# Patient Record
Sex: Female | Born: 1940 | Race: White | Hispanic: No | Marital: Married | Smoking: Never smoker
Health system: Southern US, Community
[De-identification: ages and names within clinical notes are randomized; demographics above are authoritative.]

## PROBLEM LIST (undated history)

## (undated) DIAGNOSIS — K449 Diaphragmatic hernia without obstruction or gangrene: Secondary | ICD-10-CM

## (undated) DIAGNOSIS — J45909 Unspecified asthma, uncomplicated: Secondary | ICD-10-CM

## (undated) DIAGNOSIS — Q211 Atrial septal defect: Secondary | ICD-10-CM

## (undated) DIAGNOSIS — K219 Gastro-esophageal reflux disease without esophagitis: Secondary | ICD-10-CM

## (undated) DIAGNOSIS — T8859XA Other complications of anesthesia, initial encounter: Secondary | ICD-10-CM

## (undated) DIAGNOSIS — R011 Cardiac murmur, unspecified: Secondary | ICD-10-CM

## (undated) DIAGNOSIS — E039 Hypothyroidism, unspecified: Secondary | ICD-10-CM

## (undated) DIAGNOSIS — Q2112 Patent foramen ovale: Secondary | ICD-10-CM

## (undated) DIAGNOSIS — D649 Anemia, unspecified: Secondary | ICD-10-CM

## (undated) DIAGNOSIS — M1990 Unspecified osteoarthritis, unspecified site: Secondary | ICD-10-CM

## (undated) DIAGNOSIS — R519 Headache, unspecified: Secondary | ICD-10-CM

## (undated) DIAGNOSIS — N281 Cyst of kidney, acquired: Secondary | ICD-10-CM

## (undated) DIAGNOSIS — E079 Disorder of thyroid, unspecified: Secondary | ICD-10-CM

## (undated) HISTORY — PX: COLONOSCOPY: SHX174

## (undated) HISTORY — PX: UPPER GASTROINTESTINAL ENDOSCOPY: SHX188

## (undated) HISTORY — DX: Unspecified asthma, uncomplicated: J45.909

## (undated) HISTORY — PX: DIAGNOSTIC LAPAROSCOPY: SUR761

## (undated) HISTORY — DX: Atrial septal defect: Q21.1

## (undated) HISTORY — DX: Diaphragmatic hernia without obstruction or gangrene: K44.9

## (undated) HISTORY — PX: ABDOMINAL HYSTERECTOMY: SHX81

## (undated) HISTORY — PX: EYE SURGERY: SHX253

## (undated) HISTORY — PX: OTHER SURGICAL HISTORY: SHX169

## (undated) HISTORY — DX: Anemia, unspecified: D64.9

## (undated) HISTORY — DX: Disorder of thyroid, unspecified: E07.9

## (undated) HISTORY — DX: Unspecified osteoarthritis, unspecified site: M19.90

## (undated) HISTORY — DX: Gastro-esophageal reflux disease without esophagitis: K21.9

## (undated) HISTORY — DX: Patent foramen ovale: Q21.12

## (undated) HISTORY — PX: JOINT REPLACEMENT: SHX530

## (undated) HISTORY — PX: APPENDECTOMY: SHX54

## (undated) HISTORY — PX: TONSILLECTOMY: SUR1361

## (undated) HISTORY — PX: RADICAL VAGINAL HYSTERECTOMY: SUR662

---

## 1968-06-22 DIAGNOSIS — Z9289 Personal history of other medical treatment: Secondary | ICD-10-CM

## 1968-06-22 HISTORY — DX: Personal history of other medical treatment: Z92.89

## 2016-06-22 DIAGNOSIS — S060XAA Concussion with loss of consciousness status unknown, initial encounter: Secondary | ICD-10-CM

## 2016-06-22 HISTORY — DX: Concussion with loss of consciousness status unknown, initial encounter: S06.0XAA

## 2019-05-15 ENCOUNTER — Ambulatory Visit (INDEPENDENT_AMBULATORY_CARE_PROVIDER_SITE_OTHER): Payer: Medicare Other

## 2019-05-15 ENCOUNTER — Telehealth: Payer: Self-pay | Admitting: *Deleted

## 2019-05-15 ENCOUNTER — Other Ambulatory Visit: Payer: Self-pay

## 2019-05-15 ENCOUNTER — Ambulatory Visit (INDEPENDENT_AMBULATORY_CARE_PROVIDER_SITE_OTHER): Payer: Medicare Other | Admitting: Podiatry

## 2019-05-15 DIAGNOSIS — L600 Ingrowing nail: Secondary | ICD-10-CM

## 2019-05-15 DIAGNOSIS — M25572 Pain in left ankle and joints of left foot: Secondary | ICD-10-CM | POA: Diagnosis not present

## 2019-05-15 DIAGNOSIS — M76829 Posterior tibial tendinitis, unspecified leg: Secondary | ICD-10-CM

## 2019-05-15 DIAGNOSIS — M25571 Pain in right ankle and joints of right foot: Secondary | ICD-10-CM

## 2019-05-15 DIAGNOSIS — M779 Enthesopathy, unspecified: Secondary | ICD-10-CM

## 2019-05-15 MED ORDER — MELOXICAM 7.5 MG PO TABS: 7.5000 mg | ORAL_TABLET | Freq: Every day | ORAL | 0 refills | Status: DC

## 2019-05-15 NOTE — Telephone Encounter (Signed)
Pt states the boot she has is to her knee, and would like a shorter one.

## 2019-05-15 NOTE — Telephone Encounter (Signed)
I spoke with pt and informed we had boots that stopped mid lower leg if she would like to pick one up we could bill her insurance. Pt states understanding.

## 2019-05-22 DIAGNOSIS — M76829 Posterior tibial tendinitis, unspecified leg: Secondary | ICD-10-CM | POA: Insufficient documentation

## 2019-05-22 NOTE — Progress Notes (Signed)
Subjective:   Patient ID: Carol Lowery, female   DOB: 78 y.o.   MRN: [id number]   HPI 78 year old female presents the office today for concerns of left ankle pain most on the medial aspect that she points to.  She states that she has similar symptoms in 2015 and she was told that she had a tear of the tendon she is in a boot for some time.  Since then she has been doing well however recently she states that she has been on her feet more she was moving to [place] and she started to have pain and swelling to the ankle.  She denies any specific injury.  She does get some pain to the outside aspect as well.  She is also concerned about ingrowing toenails but denies any drainage or pus coming from the nails today.   Review of Systems  All other systems reviewed and are negative.  No past medical history on file.   Current Outpatient Medications:  .  aspirin EC 81 MG tablet, Take 81 mg by mouth daily., Disp: , Rfl:  .  atorvastatin (LIPITOR) 10 MG tablet, Take 10 mg by mouth daily., Disp: , Rfl:  .  Biotin 2500 MCG CAPS, Take 2,500 mcg by mouth 2 (two) times daily., Disp: , Rfl:  .  Calcium-Vitamin D-Vitamin K 650-12.5-40 MG-MCG-MCG CHEW, Chew 650 mg by mouth 2 (two) times daily., Disp: , Rfl:  .  esomeprazole (NEXIUM) 20 MG capsule, Take 20 mg by mouth daily at 12 noon., Disp: , Rfl:  .  ferrous sulfate 324 MG TBEC, Take 665 mg by mouth 2 (two) times a week., Disp: , Rfl:  .  levothyroxine (SYNTHROID) 75 MCG tablet, Take 75 mcg by mouth., Disp: , Rfl:  .  Multiple Vitamin (MULTIVITAMIN) LIQD, Take 5 mLs by mouth daily., Disp: , Rfl:  .  vitamin C (ASCORBIC ACID) 500 MG tablet, Take 500 mg by mouth daily., Disp: , Rfl:  .  meloxicam (MOBIC) 7.5 MG tablet, Take 1 tablet (7.5 mg total) by mouth daily., Disp: 30 tablet, Rfl: 0  Not on File       Objective:  Physical Exam  General: AAO x3, NAD  Dermatological: Mild ingrowing of the toenails to bilateral medical hallux without signs of  infection. No tenderness today. No open lesions.   Vascular: Dorsalis Pedis artery and Posterior Tibial artery pedal pulses are 2/4 bilateral with immedate capillary fill time. Pedal hair growth present. No varicosities and no lower extremity edema present bilateral. There is no pain with calf compression, swelling, warmth, erythema.   Neruologic: Grossly intact via light touch bilateral. Vibratory intact via tuning fork bilateral. Protective threshold with Semmes Wienstein monofilament intact to all pedal sites bilateral. Patellar and Achilles deep tendon reflexes 2+ bilateral. No Babinski or clonus noted bilateral.   Musculoskeletal: There is tenderness on medial aspect of the left ankle there is localized edema to this area.  This is on the course the posterior tibial tendon. Flatfoot is present. There is no erythema associated with the swelling.  She has difficulty doing a single heel rise on the left side.  Muscular strength 5/5 in all groups tested bilateral.  Gait: Unassisted, Nonantalgic.       Assessment:   PTTD left; ingrown toenail     Plan:  -Treatment options discussed including all alternatives, risks, and complications -Etiology of symptoms were discussed -X-rays were obtained and reviewed with the patient.  No evidence of acute fracture or stress fracture  identified.  Flatfoot evident. -Given her pain and swelling do recommend immobilization in a cam boot which she has at home.  I want her to ice elevate. Prescribed mobic. Discussed side effects of the medication and directed to stop if any are to occur and call the office.  I will see her back next couple of weeks at that point she is doing better we will start rehabbing work and orthotics.  If no improvement will order an MRI. -Discussed partial nail avulsions of ingrown toenails but she was well.  Recommended.  Debridement of the nail.  Monitoring signs or symptoms of infection.  Trula Slade DPM

## 2019-06-06 ENCOUNTER — Other Ambulatory Visit: Payer: Self-pay

## 2019-06-06 ENCOUNTER — Ambulatory Visit (INDEPENDENT_AMBULATORY_CARE_PROVIDER_SITE_OTHER): Payer: Medicare Other | Admitting: Podiatry

## 2019-06-06 DIAGNOSIS — M779 Enthesopathy, unspecified: Secondary | ICD-10-CM | POA: Diagnosis not present

## 2019-06-06 DIAGNOSIS — M76829 Posterior tibial tendinitis, unspecified leg: Secondary | ICD-10-CM

## 2019-06-06 NOTE — Patient Instructions (Signed)
Look at getting a Vionic slipper    Posterior Tibial Tendinitis Rehab Ask your health care provider which exercises are safe for you. Do exercises exactly as told by your health care provider and adjust them as directed. It is normal to feel mild stretching, pulling, tightness, or discomfort as you do these exercises. Stop right away if you feel sudden pain or your pain gets worse. Do not begin these exercises until told by your health care provider. Stretching and range-of-motion exercises These exercises warm up your muscles and joints and improve the movement and flexibility in your ankle and foot. These exercises may also help to relieve pain. Standing wall calf stretch, knee straight  1. Stand with your hands against a wall. 2. Extend your left / right leg behind you, and bend your front knee slightly. If directed, place a folded washcloth under the arch of your foot for support. 3. Point the toes of your back foot slightly inward. 4. Keeping your heels on the floor and your back knee straight, shift your weight toward the wall. Do not allow your back to arch. You should feel a gentle stretch in your upper left / right calf. 5. Hold this position for __________ seconds. Repeat __________ times. Complete this exercise __________ times a day. Standing wall calf stretch, knee bent 1. Stand with your hands against a wall. 2. Extend your left / right leg behind you, and bend your front knee slightly. If directed, place a folded washcloth under the arch of your foot for support. 3. Point the toes of your back foot slightly inward. 4. Unlock your back knee so it is bent. Keep your heels on the floor. You should feel a gentle stretch deep in your lower left / right calf. 5. Hold this position for __________ seconds. Repeat __________ times. Complete this exercise __________ times a day. Strengthening exercises These exercises build strength and endurance in your ankle and foot. Endurance is the  ability to use your muscles for a long time, even after they get tired. Ankle inversion with band 1. Secure one end of a rubber exercise band or tubing to a fixed object, such as a table leg or a pole, that will stay still when the band is pulled. 2. Loop the other end of the band around the middle of your left / right foot. 3. Sit on the floor facing the object with your left / right leg extended. The band or tube should be slightly tense when your foot is relaxed. 4. Leading with your big toe, slowly bring your left / right foot and ankle inward, toward your other foot (inversion). 5. Hold this position for __________ seconds. 6. Slowly return your foot to the starting position. Repeat __________ times. Complete this exercise __________ times a day. Towel curls  1. Sit in a chair on a non-carpeted surface, and put your feet on the floor. 2. Place a towel in front of your feet. If told by your health care provider, add a __________ weight to the end of the towel. 3. Keeping your heel on the floor, put your left / right foot on the towel. 4. Pull the towel toward you by grabbing the towel with your toes and curling them under. Keep your heel on the floor while you do this. 5. Let your toes relax. 6. Grab the towel with your toes again. Keep going until the towel is completely underneath your foot. Repeat __________ times. Complete this exercise __________ times a day. Balance  exercise This exercise improves or maintains your balance. Balance is important in preventing falls. Single leg stand 1. Without wearing shoes, stand near a railing or in a doorway. You may hold on to the railing or door frame as needed for balance. 2. Stand on your left / right foot. Keep your big toe down on the floor and try to keep your arch lifted. ? If balancing in this position is too easy, try the exercise with your eyes closed or while standing on a pillow. 3. Hold this position for __________ seconds. Repeat  __________ times. Complete this exercise __________ times a day. This information is not intended to replace advice given to you by your health care provider. Make sure you discuss any questions you have with your health care provider. Document Released: 06/08/2005 Document Revised: 10/04/2018 Document Reviewed: 08/10/2018 Elsevier Patient Education  2020 [employer].

## 2019-06-07 NOTE — Progress Notes (Signed)
Subjective: 78 year old female presents the office today for follow-up evaluation of posterior tibial tendon dysfunction, left ankle pain, tendinitis.  She is been wearing the boot and she says overall it is feeling better.  She denies any recent injury or trauma or any changes otherwise since I last saw her. Denies any systemic complaints such as fevers, chills, nausea, vomiting. No acute changes since last appointment, and no other complaints at this time.   Objective: AAO x3, NAD DP/PT pulses palpable bilaterally, CRT less than 3 seconds Decrease in medial arch height upon weightbearing.  Minimal tenderness palpation of the course of the posterior tibial tendon as well as the peroneal tendon.  Tendon appears to be intact.  Minimal edema.  There is no erythema or warmth.  No pain of the Achilles tendon.  No open lesions or pre-ulcerative lesions.  No pain with calf compression, swelling, warmth, erythema  Assessment: Posterior tibial tendon dysfunction, peroneal tendinitis  Plan: -All treatment options discussed with the patient including all alternatives, risks, complications.  -At this point she is doing better.  We discussed starting some rehab, stretching exercises.  Continue to ice the area daily as well.  As she starts to feel better she can start to transition to her regular shoes and the orthotics that she had made previously.  They appear to be fitting well.  She had not been wearing the orthotics prior to onset of the symptoms I do think this is an overuse issue from her moving and on her feet more.  If no improvement recommend an MRI of the ankle. -Patient encouraged to call the office with any questions, concerns, change in symptoms.   Trula Slade DPM

## 2019-06-14 ENCOUNTER — Other Ambulatory Visit: Payer: Self-pay | Admitting: Family Medicine

## 2019-06-14 DIAGNOSIS — Z1231 Encounter for screening mammogram for malignant neoplasm of breast: Secondary | ICD-10-CM

## 2019-07-04 ENCOUNTER — Ambulatory Visit: Payer: Medicare Other | Admitting: Podiatry

## 2019-07-06 ENCOUNTER — Other Ambulatory Visit: Payer: Self-pay

## 2019-07-06 ENCOUNTER — Ambulatory Visit
Admission: RE | Admit: 2019-07-06 | Discharge: 2019-07-06 | Disposition: A | Discharge status: Home / Self Care | Payer: Medicare Other | Source: Ambulatory Visit | Attending: Family Medicine | Admitting: Family Medicine

## 2019-07-06 DIAGNOSIS — Z1231 Encounter for screening mammogram for malignant neoplasm of breast: Secondary | ICD-10-CM

## 2019-07-17 ENCOUNTER — Ambulatory Visit: Payer: Medicare Other | Attending: Internal Medicine

## 2019-07-17 DIAGNOSIS — Z23 Encounter for immunization: Secondary | ICD-10-CM | POA: Insufficient documentation

## 2019-07-17 NOTE — Progress Notes (Signed)
   Covid-19 Vaccination Clinic  Name:  Carol Lowery    MRN: [id number] DOB: Jan 17, 1941  07/17/2019  Carol Lowery was observed post Covid-19 immunization for 15 minutes without incidence. She was provided with Vaccine Information Sheet and instruction to access the V-Safe system.   Carol Lowery was instructed to call 911 with any severe reactions post vaccine: Marland Kitchen Difficulty breathing  . Swelling of your face and throat  . A fast heartbeat  . A bad rash all over your body  . Dizziness and weakness    Immunizations Administered    Name Date Dose VIS Date Route   Pfizer COVID-19 Vaccine 07/17/2019 11:22 AM 0.3 mL 06/02/2019 Intramuscular   Manufacturer: [employer], [employer]   Lot: [id number]   NDC: [address]-5

## 2019-08-07 ENCOUNTER — Ambulatory Visit: Payer: Medicare Other | Attending: Internal Medicine

## 2019-08-07 DIAGNOSIS — Z23 Encounter for immunization: Secondary | ICD-10-CM | POA: Insufficient documentation

## 2019-08-07 NOTE — Progress Notes (Signed)
   Covid-19 Vaccination Clinic  Name:  Carol Lowery    MRN: [id number] DOB: 06/25/1940  08/07/2019  Carol Lowery was observed post Covid-19 immunization for 15 minutes without incidence. She was provided with Vaccine Information Sheet and instruction to access the V-Safe system.   Carol Lowery was instructed to call 911 with any severe reactions post vaccine: Marland Kitchen Difficulty breathing  . Swelling of your face and throat  . A fast heartbeat  . A bad rash all over your body  . Dizziness and weakness    Immunizations Administered    Name Date Dose VIS Date Route   Pfizer COVID-19 Vaccine 08/07/2019 11:19 AM 0.3 mL 06/02/2019 Intramuscular   Manufacturer: [employer], [employer]   Lot: [id number]   NDC: [address]-0

## 2020-05-23 ENCOUNTER — Other Ambulatory Visit: Payer: Self-pay | Admitting: Family Medicine

## 2020-05-23 DIAGNOSIS — Z1231 Encounter for screening mammogram for malignant neoplasm of breast: Secondary | ICD-10-CM

## 2020-07-03 ENCOUNTER — Telehealth: Payer: Self-pay

## 2020-07-03 NOTE — Telephone Encounter (Signed)
NOTES ON FILE FROM [facility] [phone] SENT REFERRAL TO SCHEDULING 

## 2020-07-09 ENCOUNTER — Ambulatory Visit: Payer: Medicare Other

## 2020-07-11 ENCOUNTER — Ambulatory Visit
Admission: RE | Admit: 2020-07-11 | Discharge: 2020-07-11 | Disposition: A | Discharge status: Home / Self Care | Payer: Medicare Other | Source: Ambulatory Visit | Attending: Family Medicine | Admitting: Family Medicine

## 2020-07-11 ENCOUNTER — Other Ambulatory Visit: Payer: Self-pay

## 2020-07-11 DIAGNOSIS — Z1231 Encounter for screening mammogram for malignant neoplasm of breast: Secondary | ICD-10-CM

## 2020-07-26 ENCOUNTER — Ambulatory Visit (INDEPENDENT_AMBULATORY_CARE_PROVIDER_SITE_OTHER): Payer: Medicare Other | Admitting: Cardiovascular Disease

## 2020-07-26 ENCOUNTER — Other Ambulatory Visit: Payer: Self-pay

## 2020-07-26 ENCOUNTER — Encounter: Payer: Self-pay | Admitting: Cardiovascular Disease

## 2020-07-26 VITALS — BP 128/76 | HR 75 | Ht 61.0 in | Wt 143.8 lb

## 2020-07-26 DIAGNOSIS — Q211 Atrial septal defect: Secondary | ICD-10-CM | POA: Diagnosis not present

## 2020-07-26 DIAGNOSIS — Q2112 Patent foramen ovale: Secondary | ICD-10-CM | POA: Insufficient documentation

## 2020-07-26 NOTE — Patient Instructions (Signed)
   Medication Instructions:  Your physician recommends that you continue on your current medications as directed. Please refer to the Current Medication list given to you today.  *If you need a refill on your cardiac medications before your next appointment, please call your pharmacy*   Lab Work: none If you have labs (blood work) drawn today and your tests are completely normal, you will receive your results only by: MyChart Message (if you have MyChart) OR A paper copy in the mail If you have any lab test that is abnormal or we need to change your treatment, we will call you to review the results.   Testing/Procedures: none   Follow-Up: At [facility], you and your health needs are our priority.  As part of our continuing mission to provide you with exceptional heart care, we have created designated Provider Care Teams.  These Care Teams include your primary Cardiologist (physician) and Advanced Practice Providers (APPs -  Physician Assistants and Nurse Practitioners) who all work together to provide you with the care you need, when you need it.  We recommend signing up for the patient portal called "MyChart".  Sign up information is provided on this After Visit Summary.  MyChart is used to connect with patients for Virtual Visits (Telemedicine).  Patients are able to view lab/test results, encounter notes, upcoming appointments, etc.  Non-urgent messages can be sent to your provider as well.   To learn more about what you can do with MyChart, go to [email or web address].    Your next appointment:   1 year(s)  The format for your next appointment:   In Person  Provider:   You may see Philip Nahser, MD or one of the following Advanced Practice Providers on your designated Care Team:   Scott Weaver, PA-C Vin Bhagat, PA-C 

## 2020-07-26 NOTE — Progress Notes (Signed)
Cardiology Office Note:    Date:  07/26/2020   ID:  Carol Lowery, DOB 11/15/1940, MRN [id number]  PCP:  Carol Hale, MD  [facility] Cardiologist:  Carol Miss, MD  [facility] Electrophysiologist:  None   Referring MD: Carol Lowery, *   No chief complaint on file.   Feb. 4, 2022   Carol Lowery is a 80 y.o. female with a hx of PFO.  She followed with Dr. Achilles Lowery in [place], [place]  [phone])  She follwed with Dr.Gladstone yearly   No Cp or dyspnea.  Exercises regularly ,  10,000 steps a day , 5- miles on stationary bike a day  No CP , no dyspnea,  No syncope  Her PFO was diagnosed by echo ( after her ophthamologist found an abn. )  She also has a history of hyperlipidemia and is on Lipitor.  She has a history of hypothyroidism and takes Synthroid.  She takes aspirin 81 mg a day.  Past Medical History:  Diagnosis Date  . Patent foramen ovale     History reviewed. No pertinent surgical history.  Current Medications: Current Meds  Medication Sig  . aspirin EC 81 MG tablet Take 81 mg by mouth daily.  Marland Kitchen atorvastatin (LIPITOR) 10 MG tablet Take 10 mg by mouth daily.  . Biotin 2500 MCG CAPS Take 2,500 mcg by mouth 2 (two) times daily.  . Calcium-Vitamin D-Vitamin K 650-12.5-40 MG-MCG-MCG CHEW Chew 650 mg by mouth daily at 12 noon.  Marland Kitchen esomeprazole (NEXIUM) 20 MG capsule Take 20 mg by mouth daily at 12 noon.  Marland Kitchen levothyroxine (SYNTHROID) 75 MCG tablet Take 75 mcg by mouth.  . Multiple Vitamins-Minerals (WOMENS 50+ MULTI VITAMIN/MIN) TABS Take 2 tablets by mouth daily.  . [DISCONTINUED] Multiple Vitamin (MULTIVITAMIN) LIQD Take 5 mLs by mouth daily.     Allergies:   Sulfa antibiotics, Velosef [cephradine], and Other   Social History   Socioeconomic History  . Marital status: Married    Spouse name: Not on file  . Number of children: Not on file  . Years of education: Not on file  . Highest education level: Not on file   Occupational History  . Not on file  Tobacco Use  . Smoking status: Never Smoker  . Smokeless tobacco: Never Used  Substance and Sexual Activity  . Alcohol use: Never  . Drug use: Never  . Sexual activity: Not on file  Other Topics Concern  . Not on file  Social History Narrative  . Not on file   Social Determinants of Health   Financial Resource Strain: Not on file  Food Insecurity: Not on file  Transportation Needs: Not on file  Physical Activity: Not on file  Stress: Not on file  Social Connections: Not on file     Family History: The patient's family history includes Bone cancer in her father; Brain cancer in her mother; Colon cancer in her father; High blood pressure in her brother; Liver cancer in her father; Lung cancer in her father; Raynaud syndrome in her father.  ROS:   Please see the history of present illness.     All other systems reviewed and are negative.  EKGs/Labs/Other Studies Reviewed:    The following studies were reviewed today:   EKG  Feb. 4, 2022:  NSR at 75.   No ST or T wave changes.   Recent Labs: No results found for requested labs within last 8760 hours.  Recent Lipid Panel No results  found for: CHOL, TRIG, HDL, CHOLHDL, VLDL, LDLCALC, LDLDIRECT   Risk Assessment/Calculations:       Physical Exam:    VS:  BP 128/76   Pulse 75   Ht 5\' 1"  (1.549 m)   Wt 143 lb 12.8 oz (65.2 kg)   SpO2 97%   BMI 27.17 kg/m     Wt Readings from Last 3 Encounters:  07/26/20 143 lb 12.8 oz (65.2 kg)     GEN:  Well nourished, well developed in no acute distress HEENT: Normal NECK: No JVD; No carotid bruits LYMPHATICS: No lymphadenopathy CARDIAC: RRR, no murmurs, rubs, gallops RESPIRATORY:  Clear to auscultation without rales, wheezing or rhonchi  ABDOMEN: Soft, non-tender, non-distended MUSCULOSKELETAL:  No edema; No deformity  SKIN: Warm and dry NEUROLOGIC:  Alert and oriented x 3 PSYCHIATRIC:  Normal affect   ASSESSMENT:    1. PFO  (patent foramen ovale)    PLAN:    In order of problems listed above:  1. Patent foramen ovale: Carol Lowery presents for further evaluation of a patent foramen ovale.  This was discovered incidentally approximately 8 years ago.  She was followed annually by her doctor up in [place].  She is not had any signs or symptoms of stroke or TIA.  She denies any chest pain or shortness of breath.  I do do not see any signs or symptoms of right ventricular enlargement.  She does not have a cardiac murmur.  At this time I think she is very stable.  She has had a small TIA in the past so I would agree with continuing aspirin.  She is also on low-dose atorvastatin for what is likely very mild hyperlipidemia.  She will continue to follow-up with her primary medical doctor.  I will see her again in 1 year.        Medication Adjustments/Labs and Tests Ordered: Current medicines are reviewed at length with the patient today.  Concerns regarding medicines are outlined above.  Orders Placed This Encounter  Procedures  . EKG 12-Lead   No orders of the defined types were placed in this encounter.   Patient Instructions  Medication Instructions:  Your physician recommends that you continue on your current medications as directed. Please refer to the Current Medication list given to you today.  *If you need a refill on your cardiac medications before your next appointment, please call your pharmacy*   Lab Work: none If you have labs (blood work) drawn today and your tests are completely normal, you will receive your results only by: [place] MyChart Message (if you have MyChart) OR . A paper copy in the mail If you have any lab test that is abnormal or we need to change your treatment, we will call you to review the results.   Testing/Procedures: none   Follow-Up: At [facility], you and your health needs are our priority.  As part of our continuing mission to provide you with exceptional heart care,  we have created designated Provider Care Teams.  These Care Teams include your primary Cardiologist (physician) and Advanced Practice Providers (APPs -  Physician Assistants and Nurse Practitioners) who all work together to provide you with the care you need, when you need it.  We recommend signing up for the patient portal called "MyChart".  Sign up information is provided on this After Visit Summary.  MyChart is used to connect with patients for Virtual Visits (Telemedicine).  Patients are able to view lab/test results, encounter notes, upcoming appointments,  etc.  Non-urgent messages can be sent to your provider as well.   To learn more about what you can do with MyChart, go to [email or web address].    Your next appointment:   1 year(s)  The format for your next appointment:   In Person  Provider:   You may see Carol Miss, MD or one of the following Advanced Practice Providers on your designated Care Team:    Tereso Newcomer, PA-C  Chelsea Aus, [place]        Signed, Carol Miss, MD  07/26/2020 9:26 AM    [facility]

## 2021-04-10 ENCOUNTER — Ambulatory Visit
Admission: RE | Admit: 2021-04-10 | Discharge: 2021-04-10 | Disposition: A | Discharge status: Home / Self Care | Payer: Medicare Other | Source: Ambulatory Visit | Attending: Family Medicine | Admitting: Family Medicine

## 2021-04-10 ENCOUNTER — Other Ambulatory Visit: Payer: Self-pay | Admitting: Family Medicine

## 2021-04-10 DIAGNOSIS — R0602 Shortness of breath: Secondary | ICD-10-CM

## 2021-05-30 ENCOUNTER — Other Ambulatory Visit: Payer: Self-pay | Admitting: Family Medicine

## 2021-05-30 DIAGNOSIS — Z1231 Encounter for screening mammogram for malignant neoplasm of breast: Secondary | ICD-10-CM

## 2021-07-14 ENCOUNTER — Ambulatory Visit
Admission: RE | Admit: 2021-07-14 | Discharge: 2021-07-14 | Disposition: A | Discharge status: Home / Self Care | Payer: Medicare Other | Source: Ambulatory Visit | Attending: Family Medicine | Admitting: Family Medicine

## 2021-07-14 DIAGNOSIS — Z1231 Encounter for screening mammogram for malignant neoplasm of breast: Secondary | ICD-10-CM

## 2021-07-17 ENCOUNTER — Other Ambulatory Visit: Payer: Self-pay | Admitting: Family Medicine

## 2021-07-17 DIAGNOSIS — R0602 Shortness of breath: Secondary | ICD-10-CM

## 2021-07-17 DIAGNOSIS — K449 Diaphragmatic hernia without obstruction or gangrene: Secondary | ICD-10-CM

## 2021-07-25 ENCOUNTER — Other Ambulatory Visit: Payer: Self-pay | Admitting: Family Medicine

## 2021-07-25 DIAGNOSIS — E2839 Other primary ovarian failure: Secondary | ICD-10-CM

## 2021-08-05 ENCOUNTER — Ambulatory Visit
Admission: RE | Admit: 2021-08-05 | Discharge: 2021-08-05 | Disposition: A | Discharge status: Home / Self Care | Payer: Medicare Other | Source: Ambulatory Visit | Attending: Family Medicine | Admitting: Family Medicine

## 2021-08-05 DIAGNOSIS — E2839 Other primary ovarian failure: Secondary | ICD-10-CM

## 2021-08-07 ENCOUNTER — Other Ambulatory Visit: Payer: Self-pay

## 2021-08-07 ENCOUNTER — Ambulatory Visit
Admission: RE | Admit: 2021-08-07 | Discharge: 2021-08-07 | Disposition: A | Discharge status: Home / Self Care | Payer: Medicare Other | Source: Ambulatory Visit | Attending: Family Medicine | Admitting: Family Medicine

## 2021-08-07 DIAGNOSIS — R0602 Shortness of breath: Secondary | ICD-10-CM

## 2021-08-07 DIAGNOSIS — K449 Diaphragmatic hernia without obstruction or gangrene: Secondary | ICD-10-CM

## 2021-08-11 ENCOUNTER — Other Ambulatory Visit: Payer: Self-pay | Admitting: Family Medicine

## 2021-08-11 DIAGNOSIS — N281 Cyst of kidney, acquired: Secondary | ICD-10-CM

## 2021-08-12 ENCOUNTER — Ambulatory Visit (INDEPENDENT_AMBULATORY_CARE_PROVIDER_SITE_OTHER): Payer: Medicare Other | Admitting: Cardiovascular Disease

## 2021-08-12 ENCOUNTER — Encounter: Payer: Self-pay | Admitting: Cardiovascular Disease

## 2021-08-12 ENCOUNTER — Other Ambulatory Visit: Payer: Self-pay

## 2021-08-12 VITALS — BP 136/78 | HR 88 | Ht 61.0 in | Wt 140.2 lb

## 2021-08-12 DIAGNOSIS — Q2112 Patent foramen ovale: Secondary | ICD-10-CM

## 2021-08-12 DIAGNOSIS — R0602 Shortness of breath: Secondary | ICD-10-CM | POA: Diagnosis not present

## 2021-08-12 NOTE — Progress Notes (Signed)
Cardiology Office Note:    Date:  08/12/2021   ID:  Carol Lowery, DOB 11-17-1940, MRN [id number]  PCP:  Carol Hale, MD  [facility] Cardiologist:  Carol Miss, MD  [facility] Electrophysiologist:  None   Referring MD: Carol Lowery, *   Chief Complaint  Patient presents with   patent foramen ovale    Feb. 4, 2022   Carol Lowery is a 81 y.o. female with a hx of PFO.  She followed with Dr. Achilles Lowery in [place], [place]  [phone])  She follwed with Dr.Gladstone yearly   No Cp or dyspnea.  Exercises regularly ,  10,000 steps a day , 5- miles on stationary bike a day  No CP , no dyspnea,  No syncope  Her PFO was diagnosed by echo ( after her ophthamologist found an abn. )  She also has a history of hyperlipidemia and is on Lipitor.  She has a history of hypothyroidism and takes Synthroid.  She takes aspirin 81 mg a day.   Feb. 21, 2023 Carol Lowery is seen today for follow up of her PFO Had COVID in Dec.  Has had dyspnea  Has been short of breath since   Has a large hiatal hernia    Past Medical History:  Diagnosis Date   Patent foramen ovale     History reviewed. No pertinent surgical history.  Current Medications: Current Meds  Medication Sig   Ascorbic Acid (VITAMIN C) 500 MG CHEW 1 tablet   aspirin EC 81 MG tablet Take 81 mg by mouth daily.   atorvastatin (LIPITOR) 10 MG tablet Take 10 mg by mouth daily.   Biotin 2500 MCG CAPS Take 2,500 mcg by mouth 2 (two) times daily.   Calcium-Vitamin D-Vitamin K 650-12.5-40 MG-MCG-MCG CHEW Chew 650 mg by mouth daily at 12 noon.   esomeprazole (NEXIUM) 20 MG capsule Take 20 mg by mouth daily at 12 noon.   levothyroxine (SYNTHROID) 75 MCG tablet Take 75 mcg by mouth.   Multiple Vitamins-Minerals (WOMENS 50+ MULTI VITAMIN/MIN) TABS Take 2 tablets by mouth daily.     Allergies:   Sulfa antibiotics, Velosef [cephradine], Garlic, and Other   Social History   Socioeconomic History    Marital status: Married    Spouse name: Not on file   Number of children: Not on file   Years of education: Not on file   Highest education level: Not on file  Occupational History   Not on file  Tobacco Use   Smoking status: Never   Smokeless tobacco: Never  Substance and Sexual Activity   Alcohol use: Never   Drug use: Never   Sexual activity: Not on file  Other Topics Concern   Not on file  Social History Narrative   Not on file   Social Determinants of Health   Financial Resource Strain: Not on file  Food Insecurity: Not on file  Transportation Needs: Not on file  Physical Activity: Not on file  Stress: Not on file  Social Connections: Not on file     Family History: The patient's family history includes Bone cancer in her father; Brain cancer in her mother; Colon cancer in her father; High blood pressure in her brother; Liver cancer in her father; Lung cancer in her father; Raynaud syndrome in her father.  ROS:   Please see the history of present illness.     All other systems reviewed and are negative.  EKGs/Labs/Other Studies Reviewed:    The following  studies were reviewed today:   EKGL:   August 12, 2021: Normal sinus rhythm at 88.  No ST or T wave changes.  Recent Labs: No results found for requested labs within last 8760 hours.  Recent Lipid Panel No results found for: CHOL, TRIG, HDL, CHOLHDL, VLDL, LDLCALC, LDLDIRECT   Risk Assessment/Calculations:       Physical Exam:    Physical Exam: Blood pressure 136/78, pulse 88, height 5\' 1"  (1.549 m), weight 140 lb 3.2 oz (63.6 kg), SpO2 97 %.  GEN:  eldelry female   HEENT: Normal NECK: No JVD; No carotid bruits LYMPHATICS: No lymphadenopathy CARDIAC: RRR , no murmurs, rubs, gallops RESPIRATORY:  Clear to auscultation without rales, wheezing or rhonchi  ABDOMEN: Soft, non-tender, non-distended MUSCULOSKELETAL:  No edema; No deformity  SKIN: Warm and dry NEUROLOGIC:  Alert and oriented x  3   ASSESSMENT:    1. PFO (patent foramen ovale)   2. Shortness of breath     PLAN:      Patent foramen ovale: Carol Lowery presents for further evaluation of a patent foramen ovale.    No recurrent neuro issues .   2.  Episodes of sinus tach.  Likely related to her COVID infection  3.  Dyspnea:   likely due to her deconditioned. Will  consider getting an echo if she does not improve over several months        Medication Adjustments/Labs and Tests Ordered: Current medicines are reviewed at length with the patient today.  Concerns regarding medicines are outlined above.  Orders Placed This Encounter  Procedures   EKG 12-Lead   No orders of the defined types were placed in this encounter.   Patient Instructions  Medication Instructions:  Your physician recommends that you continue on your current medications as directed. Please refer to the Current Medication list given to you today.  *If you need a refill on your cardiac medications before your next appointment, please call your pharmacy*  Follow-Up: At [facility], you and your health needs are our priority.  As part of our continuing mission to provide you with exceptional heart care, we have created designated Provider Care Teams.  These Care Teams include your primary Cardiologist (physician) and Advanced Practice Providers (APPs -  Physician Assistants and Nurse Practitioners) who all work together to provide you with the care you need, when you need it.   Your next appointment:   1 year(s)  The format for your next appointment:   In Person  Provider:   [facility], PA-C, Chelsea Aus, PA-C, Jari Favre, PA-C, Ronie Spies, NP, Robin Searing, NP, Nada Boozer, PA-C, Jacolyn Reedy, NP, Eligha Bridegroom, PA-C, or Tereso Newcomer, NP       Signed, Cyndi Bender, MD  08/12/2021 5:34 PM    [facility]

## 2021-08-12 NOTE — Patient Instructions (Signed)
Medication Instructions:  Your physician recommends that you continue on your current medications as directed. Please refer to the Current Medication list given to you today.  *If you need a refill on your cardiac medications before your next appointment, please call your pharmacy*  Follow-Up: At [facility], you and your health needs are our priority.  As part of our continuing mission to provide you with exceptional heart care, we have created designated Provider Care Teams.  These Care Teams include your primary Cardiologist (physician) and Advanced Practice Providers (APPs -  Physician Assistants and Nurse Practitioners) who all work together to provide you with the care you need, when you need it.   Your next appointment:   1 year(s)  The format for your next appointment:   In Person  Provider:   Chelsea Aus, PA-C, Jari Favre, PA-C, Ronie Spies, PA-C, Robin Searing, NP, Nada Boozer, NP, Jacolyn Reedy, PA-C, Eligha Bridegroom, NP, Tereso Newcomer, PA-C, or Cyndi Bender, NP

## 2021-08-13 ENCOUNTER — Ambulatory Visit
Admission: RE | Admit: 2021-08-13 | Discharge: 2021-08-13 | Disposition: A | Discharge status: Home / Self Care | Payer: Medicare Other | Source: Ambulatory Visit | Attending: Family Medicine | Admitting: Family Medicine

## 2021-08-13 DIAGNOSIS — N281 Cyst of kidney, acquired: Secondary | ICD-10-CM

## 2021-09-08 NOTE — Progress Notes (Signed)
? ?   ?[address] ?      [place] [address] ?            [phone]   ?                 ?[name] ?[place] Record [id number] ?Date of Birth: 09/23/1940 ? ?Referring: Glenis Smoker, * ?Primary Care: Glenis Smoker, MD ?Primary Cardiologist: Mertie Moores, MD ? ?Chief Complaint:    ?Chief Complaint  ?Patient presents with  ? Hiatal Hernia  ?  Surgical consult, Chest CT 08/07/21  ? ? ?History of Present Illness:    ?Carol Lowery 81 y.o. female who presents for surgical evaluation of a large hiatal hernia.  She recently moved to [place] 2 years ago was noted to have a hiatal hernia on cross-sectional imaging during work-up of a pneumonia after COVID infection.  She is referred from her primary care physician. ? ?In regards to her symptoms, she does admit to some dysphagia as well as some exertional dyspnea.  She does have a chronic cough which is worse during allergy seasons.  She denies any odynophagia.  She does have reflux but this is controlled with Nexium, and occasionally takes Tums on top of this.  She has had 2 aspiration events in the past, but did not develop a severe pneumonia from this.  She was also diagnosed with adult onset asthma. ?  ? ? ? ? ?Past Medical History:  ?Diagnosis Date  ? Patent foramen ovale   ? ? ?Past Surgical History:  ?Procedure Laterality Date  ? ABDOMINAL HYSTERECTOMY    ? appendectomy of left toe    ? COLONOSCOPY    ? knee replacement left Left   ? right carpal tunnel release    ? tonsils removed    ? ? ?Family History  ?Problem Relation Age of Onset  ? Brain cancer Mother   ?     age 40  ? Raynaud syndrome Father   ? Lung cancer Father   ? Liver cancer Father   ? Bone cancer Father   ? Colon cancer Father   ?     age 40  ? High blood pressure Brother   ? ? ? ?Social History  ? ?Tobacco Use  ?Smoking Status Never  ?Smokeless Tobacco Never  ?  ?Social History  ? ?Substance and Sexual Activity  ?Alcohol Use Never  ? ? ? ?Allergies   ?Allergen Reactions  ? Sulfa Antibiotics Anaphylaxis  ? Velosef [Cephradine] Anaphylaxis  ? Garlic   ?  Other reaction(s): vomiting  ? Other   ?  Per patient very sensitive, Cortizone, steriods, etc.   ? ? ?Current Outpatient Medications  ?Medication Sig Dispense Refill  ? Ascorbic Acid (VITAMIN C) 500 MG CHEW 1 tablet    ? aspirin EC 81 MG tablet Take 81 mg by mouth daily.    ? atorvastatin (LIPITOR) 10 MG tablet Take 10 mg by mouth daily.    ? Biotin 2500 MCG CAPS Take 2,500 mcg by mouth 2 (two) times daily.    ? Calcium-Vitamin D-Vitamin K 650-12.5-40 MG-MCG-MCG CHEW Chew 650 mg by mouth daily at 12 noon.    ? esomeprazole (NEXIUM) 20 MG capsule Take 20 mg by mouth daily at 12 noon.    ? levothyroxine (SYNTHROID) 75 MCG tablet Take 75 mcg by mouth.    ? Multiple Vitamins-Minerals (WOMENS 50+ MULTI VITAMIN/MIN) TABS Take 2 tablets by mouth daily.    ? ?  No current facility-administered medications for this visit.  ? ? ?Review of Systems  ?Constitutional: Negative.   ?Respiratory:  Positive for cough and shortness of breath.   ?Cardiovascular:  Negative for chest pain.  ?Gastrointestinal:  Positive for constipation, heartburn and vomiting. Negative for abdominal pain.  ? ? ?PHYSICAL EXAMINATION: ?BP (!) 150/70   Pulse 92   Resp 20   Ht 5\' 1"  (1.549 m)   Wt 139 lb (63 kg)   SpO2 97%   BMI 26.26 kg/m?  ?Physical Exam ?Constitutional:   ?   General: She is not in acute distress. ?   Appearance: Normal appearance. She is normal weight. She is not ill-appearing.  ?Eyes:  ?   Extraocular Movements: Extraocular movements intact.  ?Cardiovascular:  ?   Rate and Rhythm: Normal rate.  ?Pulmonary:  ?   Effort: Pulmonary effort is normal. No respiratory distress.  ?Abdominal:  ?   General: Abdomen is flat. There is no distension.  ?Musculoskeletal:     ?   General: Normal range of motion.  ?   Cervical back: Normal range of motion.  ?Skin: ?   General: Skin is warm and dry.  ?Neurological:  ?   General: No focal deficit  present.  ?   Mental Status: She is alert and oriented to person, place, and time.  ? ? ?Diagnostic Studies & Laboratory data: ?   ?CT Scan:  ?IMPRESSION: ?There is large fixed type 3 hiatal hernia with most of the stomach ?within the hernial sac. There is no significant lymphadenopathy in ?mediastinum. Thyroid is smaller than usual in size. Lung fields are ?clear of any infiltrates or pulmonary edema. There is no pleural ?effusion or pneumothorax. ?  ? ? ? ?  ?I have independently reviewed the above radiology studies  and reviewed the findings with the patient.  ? ?Recent Lab Findings: ?No results found for: WBC, HGB, HCT, PLT, GLUCOSE, CHOL, TRIG, HDL, LDLDIRECT, LDLCALC, ALT, AST, NA, K, CL, CREATININE, BUN, CO2, TSH, INR, GLUF, HGBA1C ? ? ? ? ?Assessment / Plan:   ?81 yo female with large PEH, and PFO diagnosed by echo.  No echo for review.  I had a long discussion with her and her husband about her symptoms, and I think that over the past several years she has compensated for her hiatal hernia.  On further questioning I think that she is more symptomatic than she realizes.  Of concern to me is her to aspiration of events, and her exertional dyspnea with mild activity.  I explained to her that this is likely related to the size of her hiatal hernia.  I also covered the risks of gastric volvulus.  She remains hesitant to proceed with any surgical intervention however she would like to follow-up with gastroenterology and establish care here in [place].  If she ultimately decides to pursue surgical repair she will give [place] a call. ? ?I  spent 60 minutes with the patient face to face counseling and coordination of care.   ? ?Lajuana Matte ?09/12/2021 1:07 PM ? ? ? ? ? ? ? ?

## 2021-09-12 ENCOUNTER — Other Ambulatory Visit: Payer: Self-pay

## 2021-09-12 ENCOUNTER — Institutional Professional Consult (permissible substitution) (INDEPENDENT_AMBULATORY_CARE_PROVIDER_SITE_OTHER): Payer: Medicare Other | Admitting: Thoracic Surgery (Cardiothoracic Vascular Surgery)

## 2021-09-12 ENCOUNTER — Encounter: Payer: Self-pay | Admitting: Thoracic Surgery (Cardiothoracic Vascular Surgery)

## 2021-09-12 VITALS — BP 150/70 | HR 92 | Resp 20 | Ht 61.0 in | Wt 139.0 lb

## 2021-09-12 DIAGNOSIS — E2839 Other primary ovarian failure: Secondary | ICD-10-CM | POA: Insufficient documentation

## 2021-09-12 DIAGNOSIS — E7800 Pure hypercholesterolemia, unspecified: Secondary | ICD-10-CM | POA: Insufficient documentation

## 2021-09-12 DIAGNOSIS — Q249 Congenital malformation of heart, unspecified: Secondary | ICD-10-CM | POA: Insufficient documentation

## 2021-09-12 DIAGNOSIS — G5700 Lesion of sciatic nerve, unspecified lower limb: Secondary | ICD-10-CM | POA: Insufficient documentation

## 2021-09-12 DIAGNOSIS — E039 Hypothyroidism, unspecified: Secondary | ICD-10-CM | POA: Insufficient documentation

## 2021-09-12 DIAGNOSIS — K449 Diaphragmatic hernia without obstruction or gangrene: Secondary | ICD-10-CM | POA: Diagnosis not present

## 2021-09-12 DIAGNOSIS — D509 Iron deficiency anemia, unspecified: Secondary | ICD-10-CM | POA: Insufficient documentation

## 2021-09-15 ENCOUNTER — Encounter: Payer: Self-pay | Admitting: Internal Medicine

## 2021-11-03 ENCOUNTER — Encounter: Payer: Self-pay | Admitting: *Deleted

## 2021-11-04 ENCOUNTER — Ambulatory Visit (INDEPENDENT_AMBULATORY_CARE_PROVIDER_SITE_OTHER): Payer: Medicare Other | Admitting: Internal Medicine

## 2021-11-04 ENCOUNTER — Encounter: Payer: Self-pay | Admitting: Internal Medicine

## 2021-11-04 VITALS — BP 130/70 | HR 92 | Ht 61.0 in | Wt 135.0 lb

## 2021-11-04 DIAGNOSIS — K449 Diaphragmatic hernia without obstruction or gangrene: Secondary | ICD-10-CM

## 2021-11-04 DIAGNOSIS — Z86010 Personal history of colonic polyps: Secondary | ICD-10-CM | POA: Diagnosis not present

## 2021-11-04 DIAGNOSIS — Z800 Family history of malignant neoplasm of digestive organs: Secondary | ICD-10-CM | POA: Diagnosis not present

## 2021-11-04 DIAGNOSIS — K5909 Other constipation: Secondary | ICD-10-CM | POA: Diagnosis not present

## 2021-11-04 NOTE — Progress Notes (Signed)
Patient ID: Stephanne Greeley, female   DOB: [birth year], 81 y.o.   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  She is here today with her husband. ? ?She reports that her GI history consists of prior colonoscopies the last being around 2012 in [place].  She has had several colonoscopies with several small "benign" polyps removed.  She is also had prior upper endoscopy last in 2012.  She recalls at least 2 prior esophageal dilations for solid food dysphagia. ? ?She has developed dyspnea, intermittent cough, mild dysphagia symptom with poor esophageal clearance.  She has heartburn for which 20 mg of Nexium helps tremendously.  She only needs Tums if she eats late at night or eats red meat.  Her dyspnea can be exertional or nonexertional.  She does not require oxygen.  She is able to be very active taking 10,000 steps a day and riding a bike daily for about 6 miles.  She does not have dyspnea during her bike rides.  Some early satiety.  She hears borborygmi with movement in her upper abdomen and chest. ? ?She is not having abdominal pain.  She deals with lifelong constipation and this can require straining and pushing.  She is taking a half cap of MiraLAX daily which for her seems to help.  She wonders if straining made her hiatal hernia worse/bigger. ? ?During her last EGD she recalls her hiatal hernia being described as "small". ? ?Several episodes of aspiration without pneumonia. ? ?She has been seen by cardiology and Dr. Cliffton Asters with CT surgery. ? ?Past Medical History:  ?Diagnosis Date  ? Anemia   ? Arthritis   ? Asthma   ? GERD (gastroesophageal reflux disease)   ? Hiatal hernia   ? Patent foramen ovale   ?  Thyroid disease   ? ? ?Past Surgical History:  ?Procedure Laterality Date  ? ABDOMINAL HYSTERECTOMY    ? appendectomy of left toe    ? COLONOSCOPY    ? knee replacement left Left   ? RADICAL VAGINAL HYSTERECTOMY    ? right carpal tunnel release    ? tonsils removed    ? ? ?Outpatient Medications Prior to Visit  ?Medication Sig Dispense Refill  ? Ascorbic Acid (VITAMIN C) 500 MG CHEW 1 tablet    ? aspirin EC 81 MG tablet Take 81 mg by mouth daily.    ? atorvastatin (LIPITOR) 10 MG tablet Take 10 mg by mouth daily.    ? Biotin 2500 MCG CAPS Take 2,500 mcg by mouth 2 (two) times daily.    ? Calcium-Vitamin D-Vitamin K 650-12.5-40 MG-MCG-MCG CHEW Chew 650 mg by mouth daily at 12 noon.    ? esomeprazole (NEXIUM) 20 MG capsule Take 20 mg by mouth daily at 12 noon.    ? levothyroxine (SYNTHROID) 75 MCG tablet Take 75 mcg by mouth.    ? Multiple Vitamins-Minerals (WOMENS 50+ MULTI VITAMIN/MIN) TABS Take 2 tablets by mouth daily.    ? ?No facility-administered medications prior to visit.  ? ? ?Allergies  ?Allergen Reactions  ? Sulfa Antibiotics Anaphylaxis  ? Velosef [Cephradine] Anaphylaxis  ? Garlic   ?  Other reaction(s): vomiting  ? Other   ?  Per patient very sensitive, Cortizone, steriods, etc.   ? ? ?Family History  ?Problem Relation Age of Onset  ? Brain cancer Mother   ?     age 84  ? Raynaud syndrome Father   ? Lung cancer Father   ? Liver cancer Father   ? Bone cancer Father   ? Colon cancer Father   ?     age 66  ? High blood pressure Brother   ? ? ?Social History  ? ?Tobacco Use  ? Smoking status: Never  ? Smokeless tobacco: Never  ?Substance Use Topics  ? Alcohol use: Never  ? Drug use: Never  ? ? ?ROS: ?As per history of present illness, otherwise negative ? ?BP 130/70   Pulse 92   Ht 5\' 1"  (1.549 m)   Wt 135 lb (61.2 kg)   BMI 25.51 kg/m?  ?Gen: awake, alert, NAD ?HEENT: anicteric ?CV: RRR, no mrg ?Pulm: CTA b/l ?Abd: soft, succussion splash upper abdomen, NT/ND, +BS throughout ?Ext: no c/c/e ?Neuro:  nonfocal ? ? ?RELEVANT LABS AND IMAGING: ? ?CT CHEST WITHOUT CONTRAST ?  ?TECHNIQUE: ?Multidetector CT imaging of the chest was performed following the ?standard protocol without IV contrast. ?  ?RADIATION DOSE REDUCTION: This exam was performed according to the ?departmental dose-optimization program which includes automated ?exposure control, adjustment of the mA and/or kV according to ?patient size and/or use of iterative reconstruction technique. ?  ?COMPARISON:  Previous chest radiographs done on 04/10/2021 ?  ?FINDINGS: ?Cardiovascular: Coronary artery calcifications are seen. ?  ?Mediastinum/Nodes: No significant lymphadenopathy seen. Thyroid is ?smaller than usual in size. There is large fixed complex type 3 ?hiatal hernia. Gastroesophageal junction is higher than usual in ?place in the posterior mediastinum. There is rotation of stomach ?within the hiatal hernia. ?  ?Lungs/Pleura: There is no focal pulmonary consolidation. There are ?no discrete lung nodules. There is no pleural effusion or ?pneumothorax. ?  ?Upper Abdomen: There is a fluid density structure measuring 2.5 cm ?in the central portion of left kidney. ?  ?Musculoskeletal: Unremarkable. ?  ?IMPRESSION: ?There is large fixed type 3 hiatal hernia with most of the stomach ?within the hernial sac. There is no significant lymphadenopathy in ?mediastinum. Thyroid is smaller than usual in size. Lung fields are ?clear of any infiltrates or pulmonary edema. There is no pleural ?effusion or pneumothorax. ?  ?There is 2.5 cm fluid density structure in the central portion of ?left kidney which may suggest mild ureteropelvic junction ?obstruction or parapelvic cyst. Follow-up renal sonogram may be ?considered. ?  ?  ?Electronically Signed ?  By: 04/12/2021 M.D. ?  On: 08/07/2021 16:59 ? 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  ? ?Large hiatal hernia --apparently type III by imaging with a large portion of the stomach herniated cephalad into her chest.  This very likely explains her dyspnea as well as some of her esophageal stasis type symptoms.  We discussed this at length today.  I do feel that she would benefit from hiatal hernia repair with Dr. 73.  Upper endoscopy prior to surgical repair is certainly recommended and reasonable.  We did review the risk, benefits and alternatives and she is agreeable and wishes to proceed ?--EGD in the  [facility] ?--Continue Nexium 20 mg daily ? ?2.  Family history of colon cancer/personal history of colon polyp --surveillance colonoscopy is certainly reasonable.  We discussed that this is technically not indicated for patients with a prior history of polyp or family history of colon cancer.  That said if it was negative it would certainly be reassuring and likely negate the need for colonoscopy in the future.  That said if it were positive colonoscopy would be recommended.  Given the significance of her hiatal hernia I am not sure that she could tolerate liquid bowel preparation at this time.  For this reason we will temporarily delay decision until we gather more information and she possibly proceed with hiatal hernia repair. ?--Probable eventual colonoscopy versus Cologuard ? ?3.  Constipation --lifelong with some straining required for defecation.  I am suspicious for an element of pelvic floor dysfunction.  Half dose MiraLAX has been helpful.  I will add Metamucil as well ?--Metamucil 2 teaspoons once daily working to twice daily ?--MiraLAX titrated for effect as needed ?--If symptoms persist could consider pelvic floor physical therapy ? ? ? ? ? ?ZO:[id number]:[place], Meridee ScoreKathryn S, Md ?[address] ?[place],  [place] [id number] ? ? ?

## 2021-11-04 NOTE — Patient Instructions (Signed)
You have been scheduled for an endoscopy. Please follow written instructions given to you at your visit today. ?If you use inhalers (even only as needed), please bring them with you on the day of your procedure. ? ?Please purchase the following medications over the counter and take as directed: ?Metamucil 2 teaspoons dissolved in at least 8 ounces water/juice once daily, eventually increasing to twice daily. ? ?Miralax 17 grams (1 capful) dissolved in at least 8 ounces water/juice once daily as needed. ? ?Continue Nexium 20 mg daily. ? ?If you are age 18 or older, your body mass index should be between 23-30. Your Body mass index is 25.51 kg/m?Marland Kitchen If this is out of the aforementioned range listed, please consider follow up with your Primary Care Provider. ? ?If you are age 72 or younger, your body mass index should be between 19-25. Your Body mass index is 25.51 kg/m?Marland Kitchen If this is out of the aformentioned range listed, please consider follow up with your Primary Care Provider.  ? ?________________________________________________________ ? ?The [facility] providers would like to encourage you to use [facility] to communicate with providers for non-urgent requests or questions.  Due to long hold times on the telephone, sending your provider a message by [facility] may be a faster and more efficient way to get a response.  Please allow 48 business hours for a response.  Please remember that this is for non-urgent requests.  ?_______________________________________________________ ? ?Due to recent changes in healthcare laws, you may see the results of your imaging and laboratory studies on MyChart before your provider has had a chance to review them.  We understand that in some cases there may be results that are confusing or concerning to you. Not all laboratory results come back in the same time frame and the provider may be waiting for multiple results in order to interpret others.  Please give [place] 48 hours in order for your  provider to thoroughly review all the results before contacting the office for clarification of your results.  ? ?

## 2021-11-13 ENCOUNTER — Encounter: Payer: Self-pay | Admitting: Internal Medicine

## 2021-11-13 ENCOUNTER — Ambulatory Visit (AMBULATORY_SURGERY_CENTER): Payer: Medicare Other | Admitting: Internal Medicine

## 2021-11-13 VITALS — BP 103/67 | HR 67 | Temp 98.9°F | Resp 20 | Ht 61.0 in | Wt 135.0 lb

## 2021-11-13 DIAGNOSIS — R1013 Epigastric pain: Secondary | ICD-10-CM | POA: Diagnosis not present

## 2021-11-13 DIAGNOSIS — R12 Heartburn: Secondary | ICD-10-CM | POA: Diagnosis not present

## 2021-11-13 DIAGNOSIS — K449 Diaphragmatic hernia without obstruction or gangrene: Secondary | ICD-10-CM

## 2021-11-13 MED ORDER — SODIUM CHLORIDE 0.9 % IV SOLN
500.0000 mL | Freq: Once | INTRAVENOUS | Status: DC
Start: 2021-11-13 — End: 2021-11-13

## 2021-11-13 NOTE — Op Note (Signed)
[facility] Patient Name: Carol BlalockJoann Lowery Procedure Date: 11/13/2021 2:43 PM MRN: [id number] Endoscopist: Beverley FiedlerJay M Valin Massie , MD Age: 81 Referring MD:  Date of Birth: 03/26/1941 Gender: Female Account #: [id number] Procedure:                Upper GI endoscopy Indications:              Abnormal CT of the GI tract showing large hiatal                            hernia (paraesophageal), intermittent heartburn,                            dyspnea Medicines:                Monitored Anesthesia Care Procedure:                Pre-Anesthesia Assessment:                           - Prior to the procedure, a History and Physical                            was performed, and patient medications and                            allergies were reviewed. The patient's tolerance of                            previous anesthesia was also reviewed. The risks                            and benefits of the procedure and the sedation                            options and risks were discussed with the patient.                            All questions were answered, and informed consent                            was obtained. Prior Anticoagulants: The patient has                            taken no previous anticoagulant or antiplatelet                            agents. ASA Grade Assessment: II - A patient with                            mild systemic disease. After reviewing the risks                            and benefits, the patient was deemed in  satisfactory condition to undergo the procedure.                           After obtaining informed consent, the endoscope was                            passed under direct vision. Throughout the                            procedure, the patient's blood pressure, pulse, and                            oxygen saturations were monitored continuously. The                            GIF [id number] #[id number] was introduced through the                             mouth, and advanced to the second part of duodenum.                            The upper GI endoscopy was accomplished without                            difficulty. The patient tolerated the procedure                            well. Scope In: Scope Out: Findings:                 The lower third of the esophagus was mildly                            tortuous. The esophagus is foreshortened due to                            hiatal hernia.                           A large hiatal hernia was found. The proximal                            extent of the gastric folds (end of tubular                            esophagus) was 27 cm from the incisors. The hiatal                            narrowing was 38 cm from the incisors.                           The stomach anatomy is distorted due to the large                            hiatal hernia,  but the mucosa is normal throughout.                           The examined duodenum was normal. Complications:            No immediate complications. Estimated Blood Loss:     Estimated blood loss: none. Impression:               - Tortuous esophagus.                           - Large hiatal hernia.                           - Normal stomach.                           - Normal examined duodenum.                           - No specimens collected. Recommendation:           - Patient has a contact number available for                            emergencies. The signs and symptoms of potential                            delayed complications were discussed with the                            patient. Return to normal activities tomorrow.                            Written discharge instructions were provided to the                            patient.                           - Resume previous diet.                           - Continue present medications.                           - Follow-up appointment with Dr. Cliffton Asters is                             recommended for hiatal hernia repair. Beverley Fiedler, MD 11/13/2021 3:07:36 PM This report has been signed electronically. CC Letter to:             [name]

## 2021-11-13 NOTE — Progress Notes (Signed)
Pt in recovery with monitors in place, VSS. Report given to receiving RN. Bite guard was placed with pt awake to ensure comfort. No dental or soft tissue damage noted. 

## 2021-11-13 NOTE — Progress Notes (Signed)
VS completed by CW.   Pt's states no medical or surgical changes since previsit or office visit.  

## 2021-11-13 NOTE — Progress Notes (Signed)
Patient presenting for upper endoscopy as discussed recently.  See office note dated 11/04/2021 for details including H&P  EGD to evaluate large hiatal hernia

## 2021-11-13 NOTE — Patient Instructions (Signed)
Handout given for hiatal hernia.  Follow up appointment with Dr. Cliffton Asters is recommended.  YOU HAD AN ENDOSCOPIC PROCEDURE TODAY AT THE [facility]:   Refer to the procedure report that was given to you for any specific questions about what was found during the examination.  If the procedure report does not answer your questions, please call your gastroenterologist to clarify.  If you requested that your care partner not be given the details of your procedure findings, then the procedure report has been included in a sealed envelope for you to review at your convenience later.  YOU SHOULD EXPECT: Some feelings of bloating in the abdomen. Passage of more gas than usual.  Walking can help get rid of the air that was put into your GI tract during the procedure and reduce the bloating. If you had a lower endoscopy (such as a colonoscopy or flexible sigmoidoscopy) you may notice spotting of blood in your stool or on the toilet paper. If you underwent a bowel prep for your procedure, you may not have a normal bowel movement for a few days.  Please Note:  You might notice some irritation and congestion in your nose or some drainage.  This is from the oxygen used during your procedure.  There is no need for concern and it should clear up in a day or so.  SYMPTOMS TO REPORT IMMEDIATELY:   Following upper endoscopy (EGD)  Vomiting of blood or coffee ground material  New chest pain or pain under the shoulder blades  Painful or persistently difficult swallowing  New shortness of breath  Fever of 100F or higher  Black, tarry-looking stools  For urgent or emergent issues, a gastroenterologist can be reached at any hour by calling [phone]. Do not use MyChart messaging for urgent concerns.    DIET:  We do recommend a small meal at first, but then you may proceed to your regular diet.  Drink plenty of fluids but you should avoid alcoholic beverages for 24 hours.  ACTIVITY:  You should  plan to take it easy for the rest of today and you should NOT DRIVE or use heavy machinery until tomorrow (because of the sedation medicines used during the test).    FOLLOW UP: Our staff will call the number listed on your records 48-72 hours following your procedure to check on you and address any questions or concerns that you may have regarding the information given to you following your procedure. If we do not reach you, we will leave a message.  We will attempt to reach you two times.  During this call, we will ask if you have developed any symptoms of COVID 19. If you develop any symptoms (ie: fever, flu-like symptoms, shortness of breath, cough etc.) before then, please call [phone].  If you test positive for Covid 19 in the 2 weeks post procedure, please call and report this information to [place].    If any biopsies were taken you will be contacted by phone or by letter within the next 1-3 weeks.  Please call us at [phone] if you have not heard about the biopsies in 3 weeks.    SIGNATURES/CONFIDENTIALITY: You and/or your care partner have signed paperwork which will be entered into your electronic medical record.  These signatures attest to the fact that that the information above on your After Visit Summary has been reviewed and is understood.  Full responsibility of the confidentiality of this discharge information lies with you and/or your  care-partner.  

## 2021-11-21 ENCOUNTER — Other Ambulatory Visit: Payer: Self-pay | Admitting: *Deleted

## 2021-11-21 ENCOUNTER — Ambulatory Visit (INDEPENDENT_AMBULATORY_CARE_PROVIDER_SITE_OTHER): Payer: Medicare Other | Admitting: Thoracic Surgery (Cardiothoracic Vascular Surgery)

## 2021-11-21 ENCOUNTER — Encounter: Payer: Self-pay | Admitting: *Deleted

## 2021-11-21 VITALS — BP 129/54 | HR 92 | Resp 20 | Ht 61.0 in | Wt 137.0 lb

## 2021-11-21 DIAGNOSIS — K449 Diaphragmatic hernia without obstruction or gangrene: Secondary | ICD-10-CM

## 2021-11-21 DIAGNOSIS — Z5189 Encounter for other specified aftercare: Secondary | ICD-10-CM

## 2021-11-21 NOTE — H&P (View-Only) (Signed)
[address]       [place] [address]             [phone]                                                   Carol Lowery [place] Medical Record [id number] Date of Birth: [birth year]   Referring: Glenis Smoker, * Primary Care: Glenis Smoker, MD Primary Cardiologist: Mertie Moores, MD   Chief Complaint:        Chief Complaint  Patient presents with   Hiatal Hernia      Surgical consult, Chest CT 08/07/21      History of Present Illness:    Carol Lowery 81 y.o. female who presents for surgical evaluation of a large hiatal hernia.  She recently moved to [place] 2 years ago was noted to have a hiatal hernia on cross-sectional imaging during work-up of a pneumonia after COVID infection.  She is referred from her primary care physician.   In regards to her symptoms, she does admit to some dysphagia as well as some exertional dyspnea.  She does have a chronic cough which is worse during allergy seasons.  She denies any odynophagia.  She does have reflux but this is controlled with Nexium, and occasionally takes Tums on top of this.  She has had 2 aspiration events in the past, but did not develop a severe pneumonia from this.  She was also diagnosed with adult onset asthma.               Past Medical History:  Diagnosis Date   Patent foramen ovale             Past Surgical History:  Procedure Laterality Date   ABDOMINAL HYSTERECTOMY       appendectomy of left toe       COLONOSCOPY       knee replacement left Left     right carpal tunnel release       tonsils removed               Family History  Problem Relation Age of Onset   Brain cancer Mother          age 19   Raynaud syndrome Father     Lung cancer Father     Liver cancer Father     Bone cancer Father     Colon cancer Father          age 61   High blood pressure Brother          Social History       Tobacco Use  Smoking Status Never  Smokeless Tobacco  Never    Social History       Substance and Sexual Activity  Alcohol Use Never             Allergies  Allergen Reactions   Sulfa Antibiotics Anaphylaxis   Velosef [Cephradine] Anaphylaxis   Garlic        Other reaction(s): vomiting   Other        Per patient very sensitive, Cortizone, steriods, etc.             Current Outpatient Medications  Medication Sig Dispense Refill   Ascorbic Acid (VITAMIN C) 500 MG CHEW 1 tablet  aspirin EC 81 MG tablet Take 81 mg by mouth daily.       atorvastatin (LIPITOR) 10 MG tablet Take 10 mg by mouth daily.       Biotin 2500 MCG CAPS Take 2,500 mcg by mouth 2 (two) times daily.       Calcium-Vitamin D-Vitamin K 650-12.5-40 MG-MCG-MCG CHEW Chew 650 mg by mouth daily at 12 noon.       esomeprazole (NEXIUM) 20 MG capsule Take 20 mg by mouth daily at 12 noon.       levothyroxine (SYNTHROID) 75 MCG tablet Take 75 mcg by mouth.       Multiple Vitamins-Minerals (WOMENS 50+ MULTI VITAMIN/MIN) TABS Take 2 tablets by mouth daily.        No current facility-administered medications for this visit.      Review of Systems  Constitutional: Negative.   Respiratory:  Positive for cough and shortness of breath.   Cardiovascular:  Negative for chest pain.  Gastrointestinal:  Positive for constipation, heartburn and vomiting. Negative for abdominal pain.      PHYSICAL EXAMINATION: Vitals:   11/21/21 1020  BP: (!) 129/54  Pulse: 92  Resp: 20  SpO2: 95%    Physical Exam Constitutional:      General: She is not in acute distress.    Appearance: Normal appearance. She is normal weight. She is not ill-appearing.  Eyes:     Extraocular Movements: Extraocular movements intact.  Cardiovascular:     Rate and Rhythm: Normal rate.  Pulmonary:     Effort: Pulmonary effort is normal. No respiratory distress.  Abdominal:     General: Abdomen is flat. There is no distension.  Musculoskeletal:        General: Normal range of motion.     Cervical  back: Normal range of motion.  Skin:    General: Skin is warm and dry.  Neurological:     General: No focal deficit present.     Mental Status: She is alert and oriented to person, place, and time.      Diagnostic Studies & Laboratory data:    CT Scan:  IMPRESSION: There is large fixed type 3 hiatal hernia with most of the stomach within the hernial sac. There is no significant lymphadenopathy in mediastinum. Thyroid is smaller than usual in size. Lung fields are clear of any infiltrates or pulmonary edema. There is no pleural effusion or pneumothorax.           I have independently reviewed the above radiology studies  and reviewed the findings with the patient.    Recent Lab Findings: Recent Labs  No results found for: WBC, HGB, HCT, PLT, GLUCOSE, CHOL, TRIG, HDL, LDLDIRECT, LDLCALC, ALT, AST, NA, K, CL, CREATININE, BUN, CO2, TSH, INR, GLUF, HGBA1C           Assessment / Plan:   81 yo female with large PEH, and PFO diagnosed by echo.  No echo for review.  I had a long discussion with her and her husband about her symptoms, and I think that over the past several years she has compensated for her hiatal hernia.  On further questioning I think that she is more symptomatic than she realizes.  Of concern to me is her to aspiration of events, and her exertional dyspnea with mild activity.  I explained to her that this is likely related to the size of her hiatal hernia.  I also covered the risks of gastric volvulus.  She now would like  to proceed with repair of the hernia.  She has agreed to endoscopy, assisted paraesophageal hernia repair with fundoplication.      I  spent 60 minutes with the patient face to face counseling and coordination of care.     [name] Bary Carol Lowery

## 2021-11-21 NOTE — Progress Notes (Signed)
   [address]       [place] [address]             [phone]                                                   Tiana Lowery [place] Medical Record #[id number] Date of Birth: 04/29/1941   Referring: Timberlake, Kathryn S, * Primary Care: Timberlake, Kathryn S, MD Primary Cardiologist: Philip Nahser, MD   Chief Complaint:        Chief Complaint  Patient presents with   Hiatal Hernia      Surgical consult, Chest CT 08/07/21      History of Present Illness:    Carol Lowery 81 y.o. female who presents for surgical evaluation of a large hiatal hernia.  She recently moved to [place] 2 years ago was noted to have a hiatal hernia on cross-sectional imaging during work-up of a pneumonia after COVID infection.  She is referred from her primary care physician.   In regards to her symptoms, she does admit to some dysphagia as well as some exertional dyspnea.  She does have a chronic cough which is worse during allergy seasons.  She denies any odynophagia.  She does have reflux but this is controlled with Nexium, and occasionally takes Tums on top of this.  She has had 2 aspiration events in the past, but did not develop a severe pneumonia from this.  She was also diagnosed with adult onset asthma.               Past Medical History:  Diagnosis Date   Patent foramen ovale             Past Surgical History:  Procedure Laterality Date   ABDOMINAL HYSTERECTOMY       appendectomy of left toe       COLONOSCOPY       knee replacement left Left     right carpal tunnel release       tonsils removed               Family History  Problem Relation Age of Onset   Brain cancer Mother          age 49   Raynaud syndrome Father     Lung cancer Father     Liver cancer Father     Bone cancer Father     Colon cancer Father          age 78   High blood pressure Brother          Social History       Tobacco Use  Smoking Status Never  Smokeless Tobacco  Never    Social History       Substance and Sexual Activity  Alcohol Use Never             Allergies  Allergen Reactions   Sulfa Antibiotics Anaphylaxis   Velosef [Cephradine] Anaphylaxis   Garlic        Other reaction(s): vomiting   Other        Per patient very sensitive, Cortizone, steriods, etc.             Current Outpatient Medications  Medication Sig Dispense Refill   Ascorbic Acid (VITAMIN C) 500 MG CHEW 1 tablet         aspirin EC 81 MG tablet Take 81 mg by mouth daily.       atorvastatin (LIPITOR) 10 MG tablet Take 10 mg by mouth daily.       Biotin 2500 MCG CAPS Take 2,500 mcg by mouth 2 (two) times daily.       Calcium-Vitamin D-Vitamin K 650-12.5-40 MG-MCG-MCG CHEW Chew 650 mg by mouth daily at 12 noon.       esomeprazole (NEXIUM) 20 MG capsule Take 20 mg by mouth daily at 12 noon.       levothyroxine (SYNTHROID) 75 MCG tablet Take 75 mcg by mouth.       Multiple Vitamins-Minerals (WOMENS 50+ MULTI VITAMIN/MIN) TABS Take 2 tablets by mouth daily.        No current facility-administered medications for this visit.      Review of Systems  Constitutional: Negative.   Respiratory:  Positive for cough and shortness of breath.   Cardiovascular:  Negative for chest pain.  Gastrointestinal:  Positive for constipation, heartburn and vomiting. Negative for abdominal pain.      PHYSICAL EXAMINATION: Vitals:   11/21/21 1020  BP: (!) 129/54  Pulse: 92  Resp: 20  SpO2: 95%    Physical Exam Constitutional:      General: She is not in acute distress.    Appearance: Normal appearance. She is normal weight. She is not ill-appearing.  Eyes:     Extraocular Movements: Extraocular movements intact.  Cardiovascular:     Rate and Rhythm: Normal rate.  Pulmonary:     Effort: Pulmonary effort is normal. No respiratory distress.  Abdominal:     General: Abdomen is flat. There is no distension.  Musculoskeletal:        General: Normal range of motion.     Cervical  back: Normal range of motion.  Skin:    General: Skin is warm and dry.  Neurological:     General: No focal deficit present.     Mental Status: She is alert and oriented to person, place, and time.      Diagnostic Studies & Laboratory data:    CT Scan:  IMPRESSION: There is large fixed type 3 hiatal hernia with most of the stomach within the hernial sac. There is no significant lymphadenopathy in mediastinum. Thyroid is smaller than usual in size. Lung fields are clear of any infiltrates or pulmonary edema. There is no pleural effusion or pneumothorax.           I have independently reviewed the above radiology studies  and reviewed the findings with the patient.    Recent Lab Findings: Recent Labs  No results found for: WBC, HGB, HCT, PLT, GLUCOSE, CHOL, TRIG, HDL, LDLDIRECT, LDLCALC, ALT, AST, NA, K, CL, CREATININE, BUN, CO2, TSH, INR, GLUF, HGBA1C           Assessment / Plan:   81 yo female with large PEH, and PFO diagnosed by echo.  No echo for review.  I had a long discussion with her and her husband about her symptoms, and I think that over the past several years she has compensated for her hiatal hernia.  On further questioning I think that she is more symptomatic than she realizes.  Of concern to me is her to aspiration of events, and her exertional dyspnea with mild activity.  I explained to her that this is likely related to the size of her hiatal hernia.  I also covered the risks of gastric volvulus.  She now would like   to proceed with repair of the hernia.  She has agreed to endoscopy, assisted paraesophageal hernia repair with fundoplication.      I  spent 60 minutes with the patient face to face counseling and coordination of care.     Khamila Bassinger O Lakeyta Vandenheuvel 

## 2021-12-06 NOTE — Progress Notes (Signed)
Surgical Instructions    Your procedure is scheduled on December 10, 2021.  Report to [facility] Main Entrance "A" at 0630 A.M., then check in with the Admitting office.  Call this number if you have problems the morning of surgery:  [phone]   If you have any questions prior to your surgery date call [phone]: Open Monday-Friday 8am-4pm    Remember:  Do not eat or drink after midnight the night before your surgery     Take these medicines the morning of surgery with A SIP OF WATER:  atorvastatin (LIPITOR) esomeprazole (NEXIUM) levothyroxine (SYNTHROID)  Eye drops if needed  As of today, STOP taking any Aspirin (unless otherwise instructed by your surgeon) Aleve, Naproxen, Ibuprofen, Motrin, Advil, Goody's, BC's, all herbal medications, fish oil, and all vitamins.           Do not wear jewelry or makeup Do not wear lotions, powders, perfumes, or deodorant. Do not shave 48 hours prior to surgery.   Do not bring valuables to the hospital. Do not wear nail polish, gel polish, artificial nails, or any other type of covering on natural nails (fingers and toes) If you have artificial nails or gel coating that need to be removed by a nail salon, please have this removed prior to surgery. Artificial nails or gel coating may interfere with anesthesia's ability to adequately monitor your vital signs.  [facility] is not responsible for any belongings or valuables. .   Do NOT Smoke (Tobacco/Vaping)  24 hours prior to your procedure  If you use a CPAP at night, you may bring your mask for your overnight stay.   Contacts, glasses, hearing aids, dentures or partials may not be worn into surgery, please bring cases for these belongings   For patients admitted to the hospital, discharge time will be determined by your treatment team.   Patients discharged the day of surgery will not be allowed to drive home, and someone needs to stay with them for 24 hours.   SURGICAL WAITING ROOM  VISITATION Patients having surgery or a procedure in a hospital may have two support people. Children under the age of 48 must have an adult with them who is not the patient. They may stay in the waiting area during the procedure and may switch out with other visitors. If the patient needs to stay at the hospital during part of their recovery, the visitor guidelines for inpatient rooms apply.  Please refer to the [facility] website for the visitor guidelines for Inpatients (after your surgery is over and you are in a regular room).       Special instructions:    Oral Hygiene is also important to reduce your risk of infection.  Remember - BRUSH YOUR TEETH THE MORNING OF SURGERY WITH YOUR REGULAR TOOTHPASTE   [facility]- Preparing For Surgery  Before surgery, you can play an important role. Because skin is not sterile, your skin needs to be as free of germs as possible. You can reduce the number of germs on your skin by washing with CHG (chlorahexidine gluconate) Soap before surgery.  CHG is an antiseptic cleaner which kills germs and bonds with the skin to continue killing germs even after washing.     Please do not use if you have an allergy to CHG or antibacterial soaps. If your skin becomes reddened/irritated stop using the CHG.  Do not shave (including legs and underarms) for at least 48 hours prior to first CHG shower. It is OK  to shave your face.  Please follow these instructions carefully.     Shower the NIGHT BEFORE SURGERY and the MORNING OF SURGERY with CHG Soap.   If you chose to wash your hair, wash your hair first as usual with your normal shampoo. After you shampoo, rinse your hair and body thoroughly to remove the shampoo.  Then [employer] and genitals (private parts) with your normal soap and rinse thoroughly to remove soap.  After that Use CHG Soap as you would any other liquid soap. You can apply CHG directly to the skin and wash gently with a scrungie or a clean  washcloth.   Apply the CHG Soap to your body ONLY FROM THE NECK DOWN.  Do not use on open wounds or open sores. Avoid contact with your eyes, ears, mouth and genitals (private parts). Wash Face and genitals (private parts)  with your normal soap.   Wash thoroughly, paying special attention to the area where your surgery will be performed.  Thoroughly rinse your body with warm water from the neck down.  DO NOT shower/wash with your normal soap after using and rinsing off the CHG Soap.  Pat yourself dry with a CLEAN TOWEL.  Wear CLEAN PAJAMAS to bed the night before surgery  Place CLEAN SHEETS on your bed the night before your surgery  DO NOT SLEEP WITH PETS.   Day of Surgery:  Take a shower with CHG soap. Wear Clean/Comfortable clothing the morning of surgery Do not apply any deodorants/lotions.   Remember to brush your teeth WITH YOUR REGULAR TOOTHPASTE.    If you received a COVID test during your pre-op visit, it is requested that you wear a mask when out in public, stay away from anyone that may not be feeling well, and notify your surgeon if you develop symptoms. If you have been in contact with anyone that has tested positive in the last 10 days, please notify your surgeon.    Please read over the following fact sheets that you were given.

## 2021-12-08 ENCOUNTER — Encounter (HOSPITAL_COMMUNITY)
Admission: RE | Admit: 2021-12-08 | Discharge: 2021-12-08 | Disposition: A | Discharge status: Home / Self Care | Payer: Medicare Other | Source: Ambulatory Visit | Attending: Thoracic Surgery (Cardiothoracic Vascular Surgery) | Admitting: Thoracic Surgery (Cardiothoracic Vascular Surgery)

## 2021-12-08 ENCOUNTER — Encounter (HOSPITAL_COMMUNITY): Payer: Self-pay

## 2021-12-08 ENCOUNTER — Other Ambulatory Visit: Payer: Self-pay

## 2021-12-08 ENCOUNTER — Ambulatory Visit (HOSPITAL_COMMUNITY)
Admission: RE | Admit: 2021-12-08 | Discharge: 2021-12-08 | Disposition: A | Discharge status: Home / Self Care | Payer: Medicare Other | Source: Ambulatory Visit | Attending: Thoracic Surgery (Cardiothoracic Vascular Surgery) | Admitting: Thoracic Surgery (Cardiothoracic Vascular Surgery)

## 2021-12-08 VITALS — BP 137/82 | HR 88 | Temp 97.7°F | Resp 17 | Ht 60.0 in | Wt 134.9 lb

## 2021-12-08 DIAGNOSIS — E039 Hypothyroidism, unspecified: Secondary | ICD-10-CM | POA: Insufficient documentation

## 2021-12-08 DIAGNOSIS — J45909 Unspecified asthma, uncomplicated: Secondary | ICD-10-CM | POA: Diagnosis not present

## 2021-12-08 DIAGNOSIS — Z01818 Encounter for other preprocedural examination: Secondary | ICD-10-CM | POA: Diagnosis present

## 2021-12-08 DIAGNOSIS — K219 Gastro-esophageal reflux disease without esophagitis: Secondary | ICD-10-CM | POA: Diagnosis not present

## 2021-12-08 DIAGNOSIS — Z20822 Contact with and (suspected) exposure to covid-19: Secondary | ICD-10-CM | POA: Diagnosis not present

## 2021-12-08 DIAGNOSIS — N281 Cyst of kidney, acquired: Secondary | ICD-10-CM | POA: Insufficient documentation

## 2021-12-08 DIAGNOSIS — R011 Cardiac murmur, unspecified: Secondary | ICD-10-CM | POA: Insufficient documentation

## 2021-12-08 DIAGNOSIS — D649 Anemia, unspecified: Secondary | ICD-10-CM | POA: Insufficient documentation

## 2021-12-08 DIAGNOSIS — Q2112 Patent foramen ovale: Secondary | ICD-10-CM | POA: Diagnosis not present

## 2021-12-08 DIAGNOSIS — K449 Diaphragmatic hernia without obstruction or gangrene: Secondary | ICD-10-CM | POA: Diagnosis not present

## 2021-12-08 DIAGNOSIS — Z5189 Encounter for other specified aftercare: Secondary | ICD-10-CM | POA: Diagnosis not present

## 2021-12-08 HISTORY — DX: Hypothyroidism, unspecified: E03.9

## 2021-12-08 HISTORY — DX: Cyst of kidney, acquired: N28.1

## 2021-12-08 HISTORY — DX: Cardiac murmur, unspecified: R01.1

## 2021-12-08 LAB — COMPREHENSIVE METABOLIC PANEL
ALT: 22 U/L (ref 0–44)
AST: 24 U/L (ref 15–41)
Albumin: 3.9 g/dL (ref 3.5–5.0)
Alkaline Phosphatase: 60 U/L (ref 38–126)
Anion gap: 10 (ref 5–15)
BUN: 15 mg/dL (ref 8–23)
CO2: 21 mmol/L — ABNORMAL LOW (ref 22–32)
Calcium: 9.3 mg/dL (ref 8.9–10.3)
Chloride: 109 mmol/L (ref 98–111)
Creatinine, Ser: 0.65 mg/dL (ref 0.44–1.00)
GFR, Estimated: >60 mL/min (ref >60)
Glucose, Bld: 94 mg/dL (ref 70–99)
Potassium: 4.2 mmol/L (ref 3.5–5.1)
Sodium: 140 mmol/L (ref 135–145)
Total Bilirubin: 0.8 mg/dL (ref 0.3–1.2)
Total Protein: 6.4 g/dL — ABNORMAL LOW (ref 6.5–8.1)

## 2021-12-08 LAB — SURGICAL PCR SCREEN
MRSA, PCR: NEGATIVE
Staphylococcus aureus: NEGATIVE

## 2021-12-08 LAB — URINALYSIS, ROUTINE W REFLEX MICROSCOPIC
Bilirubin Urine: NEGATIVE
Glucose, UA: NEGATIVE mg/dL
Hgb urine dipstick: NEGATIVE
Ketones, ur: NEGATIVE mg/dL
Nitrite: NEGATIVE
Protein, ur: NEGATIVE mg/dL
Specific Gravity, Urine: 1.004 — ABNORMAL LOW (ref 1.005–1.030)
pH: 6 (ref 5.0–8.0)

## 2021-12-08 LAB — BLOOD GAS, ARTERIAL
Acid-base deficit: 0.2 mmol/L (ref 0.0–2.0)
Bicarbonate: 22.1 mmol/L (ref 20.0–28.0)
Drawn by: 58793
O2 Saturation: 99.8 %
Patient temperature: 37
pCO2 arterial: 29 mmHg — ABNORMAL LOW (ref 32–48)
pH, Arterial: 7.49 — ABNORMAL HIGH (ref 7.35–7.45)
pO2, Arterial: 108 mmHg (ref 83–108)

## 2021-12-08 LAB — CBC
HCT: 40 % (ref 36.0–46.0)
Hemoglobin: 13.1 g/dL (ref 12.0–15.0)
MCH: 28.8 pg (ref 26.0–34.0)
MCHC: 32.8 g/dL (ref 30.0–36.0)
MCV: 87.9 fL (ref 80.0–100.0)
Platelets: 284 10*3/uL (ref 150–400)
RBC: 4.55 MIL/uL (ref 3.87–5.11)
RDW: 13.6 % (ref 11.5–15.5)
WBC: 7.4 10*3/uL (ref 4.0–10.5)
nRBC: 0 % (ref 0.0–0.2)

## 2021-12-08 LAB — PROTIME-INR
INR: 1 (ref 0.8–1.2)
Prothrombin Time: 13 seconds (ref 11.4–15.2)

## 2021-12-08 LAB — TYPE AND SCREEN
ABO/RH(D): A POS
Antibody Screen: NEGATIVE

## 2021-12-08 LAB — SARS CORONAVIRUS 2 (TAT 6-24 HRS): SARS Coronavirus 2: NEGATIVE

## 2021-12-08 LAB — APTT: aPTT: 31 seconds (ref 24–36)

## 2021-12-08 NOTE — Progress Notes (Signed)
Anesthesia Chart Review:  Case: [id number] Date/Time: 12/10/21 0815   Procedures:      XI ROBOTIC ASSISTED PARAESOPHAGEAL HERNIA REPAIR WITH FUNDOPLICATION (Chest)     ESOPHAGOGASTRODUODENOSCOPY (EGD)   Anesthesia type: General   Pre-op diagnosis: PEH   Location: [facility] OR ROOM 10 / [facility] OR   Surgeons: [name], MD       DISCUSSION: Patient is a 81 year old female scheduled for the above procedure.  History includes never smoker, hypothyroidism, murmur/PFO (mild MR, PFO with minimal right to left shunt 08/14/15 TEE), asthma, GERD, hiatal hernia, anemia, renal cyst (left).  She had COVID-19 around December 2022. PCP ordered a chest CT in February for SOB which showed a large fixed type 3 hiatal hernia with most of the stomach within the hernial sac. Lungs were clear, no significant lymphadenopathy, coronary artery calcifications seen, possible left renal cysts. She was referred to CT surgery and GI. She underwent EGD on 11/14/23 which showed tortuous esophagus, large hiatal hernia, normal stomach. Dr. Cliffton Asters felt her exertional dyspnea was related to her hiatal hernia. She had also had two prior aspiration events. He discussed management options, and she agreed to proceed with the above procedure.   She is followed yearly by cardiologist Dr. Elease Hashimoto, last visit 08/12/21. Normal LVEF, mild MR, PFO with minimal right to left shunt by 2017 TEE done in [place].   12/08/21 COVID-19 test and CXR report are still in process. Anesthesia team to evaluate on the day of surgery.   VS: BP 137/82   Pulse 88   Temp 36.5 C (Oral)   Resp 17   Ht 5' (1.524 m)   Wt 61.2 kg   SpO2 99%   BMI 26.35 kg/m    PROVIDERS: Shon Hale, MD is PCP  - Kristeen Miss, MD is cardiologist. Previously, she was followed by Dr. Achilles Dunk in [place], [place] for PFO history, reportedly diagnosed by echo after her ophthalmologist found an abnormality. No clear CVA history and no murmur on his exam. On ASA  81 mg and statin. Last visit 08/12/21 with one year follow-up planned.  Erick Blinks, MD is GI   LABS: Labs reviewed: Acceptable for surgery. (all labs ordered are listed, but only abnormal results are displayed)  Labs Reviewed  COMPREHENSIVE METABOLIC PANEL - Abnormal; Notable for the following components:      Result Value   CO2 21 (*)    Total Protein 6.4 (*)    All other components within normal limits  BLOOD GAS, ARTERIAL - Abnormal; Notable for the following components:   pH, Arterial 7.49 (*)    pCO2 arterial 29 (*)    All other components within normal limits  URINALYSIS, ROUTINE W REFLEX MICROSCOPIC - Abnormal; Notable for the following components:   Color, Urine STRAW (*)    Specific Gravity, Urine 1.004 (*)    Leukocytes,Ua TRACE (*)    Bacteria, UA RARE (*)    All other components within normal limits  SURGICAL PCR SCREEN  SARS CORONAVIRUS 2 (TAT 6-24 HRS)  CBC  PROTIME-INR  APTT  TYPE AND SCREEN    EGD 11/13/21: IMPRESSION: - Tortuous esophagus. - Large hiatal hernia. - Normal stomach. - Normal examined duodenum. - No specimens collected.   IMAGES: CXR 12/08/21: In process.  US Renal 08/13/21: IMPRESSION: 1. No hydronephrosis 2. LEFT renal cysts are present, largest measuring up to 3.8 cm.   CT Chest 08/07/21: IMPRESSION: - There is large fixed type 3 hiatal hernia  with most of the stomach within the hernial sac. There is no significant lymphadenopathy in mediastinum. Thyroid is smaller than usual in size. Lung fields are clear of any infiltrates or pulmonary edema. There is no pleural effusion or pneumothorax. - There is 2.5 cm fluid density structure in the central portion of left kidney which may suggest mild ureteropelvic junction obstruction or parapelvic cyst. Follow-up renal sonogram may be considered.   EKG: 12/08/21: NSR   CV: TEE 08/14/15 [facility]): Findings:  - Left Ventricle  Normal left ventricular cavity size.   There is normal left ventricular wall thickness.  The left ventricular systolic function is normal.  The visually estimated ejection fraction is between 55-60%.  There is no evidence of regional wall motion abnormalities.      - Right Ventricle  Normal right ventricular cavity size.      - Atria  Both atria are normal in size.  There is no evidence of a thrombus in the left atrial appendage.  There is a minimal right to left saline contrast shunt across a patent foramen ovale. There is no evidence of an atrial septal defect. The right upper pulmonary vein drains normally into the left atrium.      - Aortic Valve  There is a trileaflet aortic valve.  There is normal aor tic valve cusp separation.  There is no aortic valve stenosis.  There is no aortic valve regurgitation.     - Mitral Valve  There is mild mitral valve regurgitation.  There is no mitral valve  stenosis.  There is no evidence of a mitral va lve cleft.      - Pulmonic Valve  The pulmonic valve is normal.  There is no evidence of pulmonic valve stenosis.      - Tricuspid Valve  Normal tricuspid valve structure.      - Great Vessels  Small plaque is seen in the descending thoracic aorta.    - Pericardium/Pleural  There is a small pericardial effusion.  There is echo density seen in the pericardial space consistent with fibrinous strands.     Conclusions:   Normal left ventricular systolic function, visually estimated ejection fraction is 55-60%, no evidence of regional wall motion abnormalities.  There is a patent foramen ovale.  No evidence of an atrialseptal defect. No evidence of a mitral valve cleft.  No evidence of pulmonic valve stenosis.  Small plaque is seen in the descending thoracic aorta. There is a small fibrinous pericardial effusion.        She reported a prior stress test in [place] about 15 years ago.   Past Medical History:  Diagnosis Date   Anemia    Arthritis    generalized   Asthma    cough varient  asthma;seasonal   GERD (gastroesophageal reflux disease)    Heart murmur    as a child and while pregnant   Hiatal hernia    Hypothyroidism    Patent foramen ovale    TEE 08/14/15 [facility]): LVEF 55-60%, no RWMA, PFO with minimal right to left shunt, no ASD, mild MR, small fibrinous pericardial effsuion   Renal cyst    per patient benign   Thyroid disease     Past Surgical History:  Procedure Laterality Date   ABDOMINAL HYSTERECTOMY     APPENDECTOMY     appendectomy of left toe     COLONOSCOPY     DIAGNOSTIC LAPAROSCOPY     bilateral knees   EYE  SURGERY     cataract removed bilaterally   JOINT REPLACEMENT     knee replacement left Left    RADICAL VAGINAL HYSTERECTOMY     right carpal tunnel release     TONSILLECTOMY     tonsils removed     UPPER GASTROINTESTINAL ENDOSCOPY      MEDICATIONS:  Ascorbic Acid (VITAMIN C) 500 MG CHEW   aspirin EC 81 MG tablet   atorvastatin (LIPITOR) 10 MG tablet   Biotin 2500 MCG CHEW   Calcium-Vitamin D-Vitamin K 650-12.5-40 MG-MCG-MCG CHEW   esomeprazole (NEXIUM) 20 MG capsule   levothyroxine (SYNTHROID) 75 MCG tablet   Multiple Vitamins-Minerals (WOMENS 50+ MULTI VITAMIN/MIN) TABS   Propylene Glycol (SYSTANE BALANCE) 0.6 % SOLN   No current facility-administered medications for this encounter.    Shonna Chock, PA-C Surgical Short Stay/Anesthesiology [facility] Phone [phone] [facility] Phone [phone] 12/08/2021 5:38 PM

## 2021-12-08 NOTE — Anesthesia Preprocedure Evaluation (Addendum)
Anesthesia Evaluation Anesthesia Physical Anesthesia Plan  ASA:   Anesthesia Plan:    Post-op Pain Management:    Induction:   PONV Risk Score and Plan:   Airway Management Planned:   Additional Equipment:   Intra-op Plan:   Post-operative Plan:   Informed Consent:   Plan Discussed with:   Anesthesia Plan Comments: (PAT note written 12/08/2021 by Shonna Chock, PA-C. )        Anesthesia Quick Evaluation

## 2021-12-08 NOTE — Progress Notes (Signed)
PCP - Dr. Jonette Mate Cardiologist - Dr. Leodis Sias  PPM/ICD - Denies Device Orders - n/a Rep Notified - n/a  Chest x-ray - 12/08/2021 EKG - 12/08/2021 Stress Test - Per patient, had one 15 years ago in [place] ECHO - 2017-in [facility] Cardiac Cath - denies  Sleep Study - Denies CPAP - n/a  No DM  Blood Thinner Instructions: n/a Aspirin Instructions: Instructed patient to follow-up with Dr. Lucilla Lame office today and get instructions on when to stop aspirin  NPO after midnight  COVID TEST- Test done at PAT visit. Result Pending   Anesthesia review: Yes. Pt has a hx of PFO. Shonna Chock, PA-C made aware during PAT visit.  Patient denies shortness of breath, fever, cough and chest pain at PAT appointment   All instructions explained to the patient, with a verbal understanding of the material. Patient agrees to go over the instructions while at home for a better understanding. Patient also instructed to self quarantine after being tested for COVID-19. The opportunity to ask questions was provided.

## 2021-12-10 ENCOUNTER — Encounter (HOSPITAL_COMMUNITY): Payer: Self-pay | Admitting: Thoracic Surgery (Cardiothoracic Vascular Surgery)

## 2021-12-10 ENCOUNTER — Encounter (HOSPITAL_COMMUNITY)
Admission: RE | Disposition: A | Discharge status: Home / Self Care | Payer: Self-pay | Source: Ambulatory Visit | Attending: Thoracic Surgery (Cardiothoracic Vascular Surgery)

## 2021-12-10 ENCOUNTER — Observation Stay (HOSPITAL_COMMUNITY)
Admission: RE | Admit: 2021-12-10 | Discharge: 2021-12-11 | Disposition: A | Discharge status: Home / Self Care | Payer: Medicare Other | Source: Ambulatory Visit | Attending: Thoracic Surgery (Cardiothoracic Vascular Surgery) | Admitting: Thoracic Surgery (Cardiothoracic Vascular Surgery)

## 2021-12-10 ENCOUNTER — Other Ambulatory Visit: Payer: Self-pay

## 2021-12-10 ENCOUNTER — Inpatient Hospital Stay (HOSPITAL_BASED_OUTPATIENT_CLINIC_OR_DEPARTMENT_OTHER): Payer: Medicare Other | Admitting: Certified Registered"

## 2021-12-10 ENCOUNTER — Inpatient Hospital Stay (HOSPITAL_COMMUNITY): Payer: Medicare Other | Admitting: Vascular Surgery

## 2021-12-10 DIAGNOSIS — E039 Hypothyroidism, unspecified: Secondary | ICD-10-CM | POA: Diagnosis not present

## 2021-12-10 DIAGNOSIS — K449 Diaphragmatic hernia without obstruction or gangrene: Secondary | ICD-10-CM

## 2021-12-10 DIAGNOSIS — Z79899 Other long term (current) drug therapy: Secondary | ICD-10-CM | POA: Insufficient documentation

## 2021-12-10 DIAGNOSIS — Z7982 Long term (current) use of aspirin: Secondary | ICD-10-CM | POA: Insufficient documentation

## 2021-12-10 DIAGNOSIS — Z8616 Personal history of COVID-19: Secondary | ICD-10-CM | POA: Insufficient documentation

## 2021-12-10 DIAGNOSIS — M1990 Unspecified osteoarthritis, unspecified site: Secondary | ICD-10-CM | POA: Diagnosis not present

## 2021-12-10 DIAGNOSIS — Z8719 Personal history of other diseases of the digestive system: Principal | ICD-10-CM

## 2021-12-10 DIAGNOSIS — D638 Anemia in other chronic diseases classified elsewhere: Secondary | ICD-10-CM

## 2021-12-10 DIAGNOSIS — Z96652 Presence of left artificial knee joint: Secondary | ICD-10-CM | POA: Insufficient documentation

## 2021-12-10 LAB — ABO/RH: ABO/RH(D): A POS

## 2021-12-10 SURGERY — REPAIR, HERNIA, PARAESOPHAGEAL, ROBOT-ASSISTED
Anesthesia: General | Site: Chest

## 2021-12-10 MED ORDER — BUPIVACAINE HCL (PF) 0.5 % IJ SOLN
INTRAMUSCULAR | Status: AC
Start: 2021-12-10 — End: ?
  Filled 2021-12-10: qty 30

## 2021-12-10 MED ORDER — PROPOFOL 10 MG/ML IV BOLUS
INTRAVENOUS | Status: DC | PRN
  Administered 2021-12-10: 80 mg via INTRAVENOUS
  Administered 2021-12-10 (×2): 50 mg via INTRAVENOUS

## 2021-12-10 MED ORDER — KETOROLAC TROMETHAMINE 15 MG/ML IJ SOLN
15.0000 mg | Freq: Four times a day (QID) | INTRAMUSCULAR | Status: DC | PRN
  Administered 2021-12-10: 15 mg via INTRAVENOUS
  Filled 2021-12-10: qty 1

## 2021-12-10 MED ORDER — FENTANYL CITRATE (PF) 250 MCG/5ML IJ SOLN
INTRAMUSCULAR | Status: AC
  Filled 2021-12-10: qty 5

## 2021-12-10 MED ORDER — ONDANSETRON HCL 4 MG/2ML IJ SOLN
INTRAMUSCULAR | Status: AC
  Filled 2021-12-10: qty 2

## 2021-12-10 MED ORDER — ESMOLOL HCL 100 MG/10ML IV SOLN
INTRAVENOUS | Status: AC
  Filled 2021-12-10: qty 10

## 2021-12-10 MED ORDER — EPINEPHRINE PF 1 MG/ML IJ SOLN
INTRAMUSCULAR | Status: AC
  Filled 2021-12-10: qty 1

## 2021-12-10 MED ORDER — ONDANSETRON HCL 4 MG/2ML IJ SOLN
INTRAMUSCULAR | Status: DC | PRN
  Administered 2021-12-10: 4 mg via INTRAVENOUS

## 2021-12-10 MED ORDER — MORPHINE SULFATE (PF) 2 MG/ML IV SOLN: 2.0000 mg | INTRAVENOUS | Status: DC | PRN

## 2021-12-10 MED ORDER — SUGAMMADEX SODIUM 200 MG/2ML IV SOLN
INTRAVENOUS | Status: DC | PRN
  Administered 2021-12-10: 120 mg via INTRAVENOUS

## 2021-12-10 MED ORDER — FENTANYL CITRATE (PF) 250 MCG/5ML IJ SOLN
INTRAMUSCULAR | Status: DC | PRN
  Administered 2021-12-10: 150 ug via INTRAVENOUS
  Administered 2021-12-10: 100 ug via INTRAVENOUS

## 2021-12-10 MED ORDER — ESMOLOL HCL 100 MG/10ML IV SOLN
INTRAVENOUS | Status: DC | PRN
  Administered 2021-12-10 (×2): 20 ug via INTRAVENOUS

## 2021-12-10 MED ORDER — LIDOCAINE 2% (20 MG/ML) 5 ML SYRINGE
INTRAMUSCULAR | Status: AC
  Filled 2021-12-10: qty 5

## 2021-12-10 MED ORDER — ROCURONIUM BROMIDE 10 MG/ML (PF) SYRINGE
PREFILLED_SYRINGE | INTRAVENOUS | Status: AC
  Filled 2021-12-10: qty 10

## 2021-12-10 MED ORDER — ROCURONIUM BROMIDE 10 MG/ML (PF) SYRINGE
PREFILLED_SYRINGE | INTRAVENOUS | Status: DC | PRN
  Administered 2021-12-10: 10 mg via INTRAVENOUS
  Administered 2021-12-10: 60 mg via INTRAVENOUS
  Administered 2021-12-10 (×2): 10 mg via INTRAVENOUS

## 2021-12-10 MED ORDER — 0.9 % SODIUM CHLORIDE (POUR BTL) OPTIME
TOPICAL | Status: DC | PRN
  Administered 2021-12-10: 2000 mL

## 2021-12-10 MED ORDER — CHLORHEXIDINE GLUCONATE 0.12 % MT SOLN
15.0000 mL | Freq: Once | OROMUCOSAL | Status: AC
  Administered 2021-12-10: 15 mL via OROMUCOSAL
  Filled 2021-12-10: qty 15

## 2021-12-10 MED ORDER — ACETAMINOPHEN 500 MG PO TABS
1000.0000 mg | ORAL_TABLET | Freq: Once | ORAL | Status: AC
  Administered 2021-12-10: 1000 mg via ORAL
  Filled 2021-12-10: qty 2

## 2021-12-10 MED ORDER — VANCOMYCIN HCL IN DEXTROSE 1-5 GM/200ML-% IV SOLN
1000.0000 mg | INTRAVENOUS | Status: AC
  Administered 2021-12-10: 1000 mg via INTRAVENOUS
  Filled 2021-12-10: qty 200

## 2021-12-10 MED ORDER — DEXAMETHASONE SODIUM PHOSPHATE 10 MG/ML IJ SOLN
INTRAMUSCULAR | Status: DC | PRN
  Administered 2021-12-10: 4 mg via INTRAVENOUS

## 2021-12-10 MED ORDER — ONDANSETRON HCL 4 MG/2ML IJ SOLN
4.0000 mg | Freq: Four times a day (QID) | INTRAMUSCULAR | Status: DC
  Administered 2021-12-11: 4 mg via INTRAVENOUS
  Filled 2021-12-10 (×2): qty 2

## 2021-12-10 MED ORDER — PROPOFOL 10 MG/ML IV BOLUS
INTRAVENOUS | Status: AC
  Filled 2021-12-10: qty 20

## 2021-12-10 MED ORDER — LACTATED RINGERS IV SOLN: INTRAVENOUS | Status: DC

## 2021-12-10 MED ORDER — EPHEDRINE SULFATE-NACL 50-0.9 MG/10ML-% IV SOSY
PREFILLED_SYRINGE | INTRAVENOUS | Status: DC | PRN
  Administered 2021-12-10 (×2): 5 mg via INTRAVENOUS

## 2021-12-10 MED ORDER — DEXTROSE-NACL 5-0.45 % IV SOLN: INTRAVENOUS | Status: DC

## 2021-12-10 MED ORDER — MIDAZOLAM HCL 2 MG/2ML IJ SOLN
INTRAMUSCULAR | Status: DC | PRN
  Administered 2021-12-10: 1 mg via INTRAVENOUS

## 2021-12-10 MED ORDER — PHENYLEPHRINE 80 MCG/ML (10ML) SYRINGE FOR IV PUSH (FOR BLOOD PRESSURE SUPPORT)
PREFILLED_SYRINGE | INTRAVENOUS | Status: DC | PRN
  Administered 2021-12-10: 160 ug via INTRAVENOUS

## 2021-12-10 MED ORDER — MIDAZOLAM HCL 2 MG/2ML IJ SOLN
INTRAMUSCULAR | Status: AC
Start: 2021-12-10 — End: ?
  Filled 2021-12-10: qty 2

## 2021-12-10 MED ORDER — HYDROMORPHONE HCL 1 MG/ML IJ SOLN: 0.2500 mg | INTRAMUSCULAR | Status: DC | PRN

## 2021-12-10 MED ORDER — LIDOCAINE 2% (20 MG/ML) 5 ML SYRINGE
INTRAMUSCULAR | Status: DC | PRN
  Administered 2021-12-10: 40 mg via INTRAVENOUS

## 2021-12-10 MED ORDER — EPHEDRINE 5 MG/ML INJ
INTRAVENOUS | Status: AC
  Filled 2021-12-10: qty 5

## 2021-12-10 MED ORDER — PHENYLEPHRINE HCL-NACL 20-0.9 MG/250ML-% IV SOLN
INTRAVENOUS | Status: DC | PRN
  Administered 2021-12-10: 35 ug/min via INTRAVENOUS

## 2021-12-10 MED ORDER — BUPIVACAINE LIPOSOME 1.3 % IJ SUSP
INTRAMUSCULAR | Status: AC
  Filled 2021-12-10: qty 20

## 2021-12-10 MED ORDER — BUPIVACAINE LIPOSOME 1.3 % IJ SUSP
INTRAMUSCULAR | Status: DC | PRN
  Administered 2021-12-10: 50 mL

## 2021-12-10 MED ORDER — ORAL CARE MOUTH RINSE: 15.0000 mL | Freq: Once | OROMUCOSAL | Status: AC

## 2021-12-10 MED ORDER — ENOXAPARIN SODIUM 40 MG/0.4ML IJ SOSY: 40.0000 mg | PREFILLED_SYRINGE | INTRAMUSCULAR | Status: DC

## 2021-12-10 MED ORDER — DEXAMETHASONE SODIUM PHOSPHATE 10 MG/ML IJ SOLN
INTRAMUSCULAR | Status: AC
Start: 2021-12-10 — End: ?
  Filled 2021-12-10: qty 1

## 2021-12-10 MED ORDER — LACTATED RINGERS IV SOLN: INTRAVENOUS | Status: DC | PRN

## 2021-12-10 SURGICAL SUPPLY — 87 items
BLADE CLIPPER SURG (BLADE) ×3 IMPLANT
BLADE SURG 11 STRL SS (BLADE) ×3 IMPLANT
BUTTON OLYMPUS DEFENDO 5 PIECE (MISCELLANEOUS) ×3 IMPLANT
CANISTER SUCT 3000ML PPV (MISCELLANEOUS) ×6 IMPLANT
CATH THORACIC 28FR (CATHETERS) IMPLANT
CNTNR URN SCR LID CUP LEK RST (MISCELLANEOUS) ×2 IMPLANT
CONT SPEC 4OZ STRL OR WHT (MISCELLANEOUS) ×1
COVER BACK TABLE 60X90IN (DRAPES) IMPLANT
DEFOGGER SCOPE WARMER CLEARIFY (MISCELLANEOUS) ×3 IMPLANT
DERMABOND ADVANCED (GAUZE/BANDAGES/DRESSINGS) ×1
DERMABOND ADVANCED .7 DNX12 (GAUZE/BANDAGES/DRESSINGS) ×4 IMPLANT
DEVICE SUTURE ENDOST 10MM (ENDOMECHANICALS) IMPLANT
DRAPE ARM DVNC X/XI (DISPOSABLE) ×8 IMPLANT
DRAPE COLUMN DVNC XI (DISPOSABLE) ×2 IMPLANT
DRAPE CV SPLIT W-CLR ANES SCRN (DRAPES) ×3 IMPLANT
DRAPE DA VINCI XI ARM (DISPOSABLE) ×4
DRAPE DA VINCI XI COLUMN (DISPOSABLE) ×1
DRAPE INCISE IOBAN 66X45 STRL (DRAPES) IMPLANT
DRAPE ORTHO SPLIT 77X108 STRL (DRAPES) ×1
DRAPE SURG ORHT 6 SPLT 77X108 (DRAPES) ×2 IMPLANT
ELECT REM PT RETURN 9FT ADLT (ELECTROSURGICAL) ×3
ELECTRODE REM PT RTRN 9FT ADLT (ELECTROSURGICAL) ×2 IMPLANT
FELT TEFLON 1X6 (MISCELLANEOUS) IMPLANT
FORCEPS GRASP COMBO 8X230 (FORCEP) ×3 IMPLANT
GLOVE BIO SURGEON STRL SZ7 (GLOVE) ×3 IMPLANT
GLOVE BIO SURGEON STRL SZ7.5 (GLOVE) ×6 IMPLANT
GLOVE ECLIPSE 6.5 STRL STRAW (GLOVE) ×2 IMPLANT
GOWN STRL REUS W/ TWL LRG LVL3 (GOWN DISPOSABLE) ×2 IMPLANT
GOWN STRL REUS W/ TWL XL LVL3 (GOWN DISPOSABLE) ×4 IMPLANT
GOWN STRL REUS W/TWL 2XL LVL3 (GOWN DISPOSABLE) ×3 IMPLANT
GOWN STRL REUS W/TWL LRG LVL3 (GOWN DISPOSABLE) ×3
GOWN STRL REUS W/TWL XL LVL3 (GOWN DISPOSABLE) ×3
GRAFT MYRIAD 5 LAYER 5X5 (Graft) ×1 IMPLANT
GRASPER SUT TROCAR 14GX15 (MISCELLANEOUS) IMPLANT
HEMOSTAT SURGICEL 2X14 (HEMOSTASIS) IMPLANT
IV NS 1000ML (IV SOLUTION)
IV NS 1000ML BAXH (IV SOLUTION) IMPLANT
KIT BASIN OR (CUSTOM PROCEDURE TRAY) ×3 IMPLANT
KIT TURNOVER KIT B (KITS) ×3 IMPLANT
MARKER SKIN DUAL TIP RULER LAB (MISCELLANEOUS) ×3 IMPLANT
NDL 18GX1X1/2 (RX/OR ONLY) (NEEDLE) IMPLANT
NDL SCLEROTHERAPY 23X2X240 (NEEDLE) IMPLANT
NEEDLE 18GX1X1/2 (RX/OR ONLY) (NEEDLE) IMPLANT
NEEDLE HYPO 22GX1.5 SAFETY (NEEDLE) ×3 IMPLANT
NEEDLE SCLEROTHERAPY 23X2X240 (NEEDLE) IMPLANT
NS IRRIG 1000ML POUR BTL (IV SOLUTION) ×6 IMPLANT
OIL SILICONE PENTAX (PARTS (SERVICE/REPAIRS)) IMPLANT
PACK CHEST (CUSTOM PROCEDURE TRAY) ×3 IMPLANT
PAD ARMBOARD 7.5X6 YLW CONV (MISCELLANEOUS) ×6 IMPLANT
PORT ACCESS TROCAR AIRSEAL 12 (TROCAR) ×2 IMPLANT
PORT ACCESS TROCAR AIRSEAL 5M (TROCAR) ×1
SEAL CANN UNIV 5-8 DVNC XI (MISCELLANEOUS) ×8 IMPLANT
SEAL XI 5MM-8MM UNIVERSAL (MISCELLANEOUS) ×4
SEALER SYNCHRO 8 IS4000 DV (MISCELLANEOUS) ×1
SEALER SYNCHRO 8 IS4000 DVNC (MISCELLANEOUS) IMPLANT
SET TRI-LUMEN FLTR TB AIRSEAL (TUBING) ×3 IMPLANT
SHEET MEDIUM DRAPE 40X70 STRL (DRAPES) ×3 IMPLANT
SPIKE FLUID TRANSFER (MISCELLANEOUS) ×3 IMPLANT
SPONGE T-LAP 18X18 ~~LOC~~+RFID (SPONGE) ×10 IMPLANT
STAPLER CANNULA SEAL DVNC XI (STAPLE) IMPLANT
STAPLER CANNULA SEAL XI (STAPLE)
SUT ETHIBOND 0 36 GRN (SUTURE) ×6 IMPLANT
SUT SILK  1 MH (SUTURE) ×1
SUT SILK 1 MH (SUTURE) ×2 IMPLANT
SUT SURGIDAC NAB ES-9 0 48 120 (SUTURE) IMPLANT
SUT VIC AB 2-0 CT1 27 (SUTURE) ×1
SUT VIC AB 2-0 CT1 TAPERPNT 27 (SUTURE) IMPLANT
SUT VIC AB 3-0 SH 27 (SUTURE) ×2
SUT VIC AB 3-0 SH 27X BRD (SUTURE) ×4 IMPLANT
SUT VIC AB 3-0 X1 27 (SUTURE) ×4 IMPLANT
SUT VICRYL 0 UR6 27IN ABS (SUTURE) ×6 IMPLANT
SUT VLOC 180 0 9IN  GS21 (SUTURE) ×2
SUT VLOC 180 0 9IN GS21 (SUTURE) IMPLANT
SYR 10ML LL (SYRINGE) IMPLANT
SYR 20CC LL (SYRINGE) ×6 IMPLANT
SYR 20ML ECCENTRIC (SYRINGE) ×3 IMPLANT
SYR 30ML SLIP (SYRINGE) ×3 IMPLANT
SYSTEM SAHARA CHEST DRAIN ATS (WOUND CARE) IMPLANT
TOWEL GREEN STERILE (TOWEL DISPOSABLE) ×3 IMPLANT
TOWEL GREEN STERILE FF (TOWEL DISPOSABLE) ×3 IMPLANT
TRAY FOLEY MTR SLVR 14FR STAT (SET/KITS/TRAYS/PACK) ×1 IMPLANT
TROCAR PORT AIRSEAL 8X120 (TROCAR) ×3 IMPLANT
TROCAR XCEL BLADELESS 5X75MML (TROCAR) ×1 IMPLANT
TROCAR XCEL NON-BLD 5MMX100MML (ENDOMECHANICALS) IMPLANT
TUBE CONNECTING 20X1/4 (TUBING) ×3 IMPLANT
TUBING ENDO SMARTCAP (MISCELLANEOUS) ×3 IMPLANT
WATER STERILE IRR 1000ML POUR (IV SOLUTION) ×4 IMPLANT

## 2021-12-10 NOTE — Anesthesia Procedure Notes (Signed)
Procedure Name: Intubation Date/Time: 12/10/2021 8:54 AM  Performed by: Anastasio Auerbach, CRNAPre-anesthesia Checklist: Patient identified, Emergency Drugs available, Suction available and Patient being monitored Patient Re-evaluated:Patient Re-evaluated prior to induction Oxygen Delivery Method: Circle system utilized Preoxygenation: Pre-oxygenation with 100% oxygen Induction Type: IV induction Ventilation: Mask ventilation without difficulty Laryngoscope Size: Mac and 3 Grade View: Grade II Tube type: Oral Tube size: 7.0 mm Number of attempts: 1 Airway Equipment and Method: Stylet Placement Confirmation: ETT inserted through vocal cords under direct vision, positive ETCO2 and breath sounds checked- equal and bilateral Secured at: 20 cm Tube secured with: Tape Dental Injury: Teeth and Oropharynx as per pre-operative assessment

## 2021-12-10 NOTE — Discharge Summary (Cosign Needed Addendum)
Physician Discharge Summary  Patient ID: Carol Lowery MRN: [id number] DOB/AGE: 01-24-1941 81 y.o.  Admit date: 12/10/2021 Discharge date: 12/11/2021  Admission Diagnoses:  Discharge Diagnoses:  Principal Problem:   S/P repair of paraesophageal hernia   Discharged Condition: good  History of Present Illness:  Carol Lowery is a 81 yo female with history of Hypercholesterolemia, hypothyroidism, PFO, and Hiatal Hernia.  The patient's hiatal hernia was an incidental finding when patient underwent imaging for pneumonia after COVID 19 infection.  She was referred to [facility] from her PCP for surgical evaluation.  She was evaluated by Dr. Cliffton Asters at which time she admitted to experiencing some dysphagia and exertional dyspnea.  She also has a chronic cough, exacerbated by allergy season.  She takes Nexium for reflux.  The patient has aspirated on 2 separate occasions but did not develop severe pneumonia for this.  Dr. Cliffton Asters felt the patients symptoms were related to her hernia.  He explained the risk of developing gastric volvulus.  She wished to proceed with surgical repair.  The risks and benefits of the procedure were explained to the patient and she was agreeable to proceed.  Hospital Course:  Carol Lowery presented to [facility] on 12/10/2021.  She was taken to the operating room and underwent Robotic Assisted Laparoscopic Repair of Paraesophageal Hernia.  She tolerated the procedure without difficulty.  The patient did well overnight.  She had some pain that was expected in her neck and back area.  She underwent Esophogram which showed no evidence of esophageal leak or residual hernia.  She was started on a dysphagia diet.  She tolerated this without difficulty.  She is medically stable for discharge home today.  Consults: None  Significant Diagnostic Studies:   Treatments: CLINICAL DATA:  1 day s/p paraesophageal repair   EXAM: ESOPHAGUS/BARIUM  SWALLOW/TABLET STUDY   TECHNIQUE: Single contrast examination was performed using water soluble contrast. This exam was performed by Sheliah Plane, PA-C, and was supervised and interpreted by Guadlupe Spanish, MD.   FLUOROSCOPY: Radiation Exposure Index (as provided by the fluoroscopic device): 19.4 mGy Kerma   COMPARISON:  Preoperative imaging.   FINDINGS: Contrast swallowed without difficulty. There is no hiatal hernia. No extravasation to suggest leak. Narrowing at the gastroesophageal junction reflecting partial fundoplication and postoperative edema with some holdup of contrast passage.   IMPRESSION: No evidence of leak or hernia. Expected appearance post partial fundoplication.     Electronically Signed   By: Guadlupe Spanish M.D.   On: 12/11/2021 11:31   Discharge Exam: Blood pressure 121/73, pulse 84, temperature 98.7 F (37.1 C), temperature source Oral, resp. rate 20, height 5' (1.524 m), weight 65.7 kg, SpO2 98 %. General appearance: alert, cooperative, and no distress Heart: regular rate and rhythm Lungs: clear to auscultation bilaterally Abdomen: soft, non-tender; bowel sounds normal; no masses,  no organomegaly Extremities: extremities normal, atraumatic, no cyanosis or edema Wound: clean and dry  Disposition: Discharge disposition: 01-Home or Self Care     Stable and discharged to home.   Allergies as of 12/11/2021       Reactions   Sulfa Antibiotics Anaphylaxis   Velosef [cephradine] Anaphylaxis   Garlic    vomiting   Other    Per patient very sensitive, Cortizone, steriods, etc. Including pain meds         Medication List     TAKE these medications    aspirin EC 81 MG tablet Take 81 mg by mouth daily.  atorvastatin 10 MG tablet Commonly known as: LIPITOR Take 10 mg by mouth daily.   Biotin 2500 MCG Chew Chew 5,000 mcg by mouth daily.   Calcium-Vitamin D-Vitamin K 650-12.5-40 MG-MCG-MCG Chew Chew 2 each by mouth daily.    esomeprazole 20 MG capsule Commonly known as: NEXIUM Take 20 mg by mouth every morning.   levothyroxine 75 MCG tablet Commonly known as: SYNTHROID Take 75 mcg by mouth daily before breakfast.   ondansetron 4 MG tablet Commonly known as: Zofran Take 1 tablet (4 mg total) by mouth every 8 (eight) hours as needed for nausea or vomiting.   Systane Balance 0.6 % Soln Generic drug: Propylene Glycol Place 1 drop into both eyes every morning.   traMADol 50 MG tablet Commonly known as: Ultram Take 1 tablet (50 mg total) by mouth every 6 (six) hours as needed.   Vitamin C 500 MG Chew Chew 1,000 mg by mouth daily.   Womens 50+ Multi Vitamin/Min Tabs Take 2 tablets by mouth daily.         Signed: Ardelle Balls, PA-C  12/11/2021, 2:19 PM

## 2021-12-10 NOTE — Plan of Care (Signed)

## 2021-12-10 NOTE — Anesthesia Postprocedure Evaluation (Signed)
Anesthesia Post Note  Patient: Carol Lowery  Procedure(s) Performed: XI ROBOTIC ASSISTED PARAESOPHAGEAL HERNIA REPAIR WITH FUNDOPLICATION (Chest) ESOPHAGOGASTRODUODENOSCOPY (EGD)     Patient location during evaluation: PACU Anesthesia Type: General Level of consciousness: awake and alert Pain management: pain level controlled Vital Signs Assessment: post-procedure vital signs reviewed and stable Respiratory status: spontaneous breathing, nonlabored ventilation and respiratory function stable Cardiovascular status: blood pressure returned to baseline and stable Postop Assessment: no apparent nausea or vomiting Anesthetic complications: no   No notable events documented.  Last Vitals:  Vitals:   12/10/21 1252 12/10/21 1322  BP: 127/60 128/63  Pulse: 80 81  Resp: 17 13  Temp:  36.7 C  SpO2: 98% 98%    Last Pain:  Vitals:   12/10/21 1322  TempSrc:   PainSc: 2                  Ayinde Swim,W. EDMOND

## 2021-12-10 NOTE — Hospital Course (Addendum)
History of Present Illness:  Carol Lowery is a 81 yo female with history of Hypercholesterolemia, hypothyroidism, PFO, and Hiatal Hernia.  The patient's hiatal hernia was an incidental finding when patient underwent imaging for pneumonia after COVID 19 infection.  She was referred to [facility] from her PCP for surgical evaluation.  She was evaluated by Dr. Cliffton Asters at which time she admitted to experiencing some dysphagia and exertional dyspnea.  She also has a chronic cough, exacerbated by allergy season.  She takes Nexium for reflux.  The patient has aspirated on 2 separate occasions but did not develop severe pneumonia for this.  Dr. Cliffton Asters felt the patients symptoms were related to her hernia.  He explained the risk of developing gastric volvulus.  She wished to proceed with surgical repair.  The risks and benefits of the procedure were explained to the patient and she was agreeable to proceed.  Hospital Course:  Carol Lowery presented to [facility] on 12/10/2021.  She was taken to the operating room and underwent Robotic Assisted Laparoscopic Repair of Paraesophageal Hernia.  She tolerated the procedure without difficulty.  The patient did well overnight.  She had some pain that was expected in her neck and back area.  She underwent Esophogram which showed no evidence of esophageal leak or residual hernia.  She was started on a dysphagia diet.  She tolerated this without difficulty.  She is medically stable for discharge home today.

## 2021-12-10 NOTE — Interval H&P Note (Signed)
History and Physical Interval Note:  12/10/2021 8:30 AM  Carol Lowery  has presented today for surgery, with the diagnosis of PEH.  The various methods of treatment have been discussed with the patient and family. After consideration of risks, benefits and other options for treatment, the patient has consented to  Procedure(s): XI ROBOTIC ASSISTED PARAESOPHAGEAL HERNIA REPAIR WITH FUNDOPLICATION (N/A) ESOPHAGOGASTRODUODENOSCOPY (EGD) (N/A) as a surgical intervention.  The patient's history has been reviewed, patient examined, no change in status, stable for surgery.  I have reviewed the patient's chart and labs.  Questions were answered to the patient's satisfaction.     Tkeya Stencil Keane Scrape

## 2021-12-10 NOTE — Brief Op Note (Signed)
12/10/2021  11:36 AM  PATIENT:  Jonet O'Hearn  81 y.o. female  PRE-OPERATIVE DIAGNOSIS:  Paraesophageal Hernia  POST-OPERATIVE DIAGNOSIS:  Paraesophageal Hernia  PROCEDURE:  Procedure(s):  XI ROBOTIC ASSISTED PARAESOPHAGEAL HERNIA REPAIR WITH FUNDOPLICATION (N/A)  ESOPHAGOGASTRODUODENOSCOPY (EGD) (N/A)  SURGEON:  Surgeon(s) and Role:    * Lightfoot, Eliezer Lofts, MD - Primary  PHYSICIAN ASSISTANT: Vania Rosero PA-C  ASSISTANTS: none   ANESTHESIA:   none  EBL:  25 mL   BLOOD ADMINISTERED:none  DRAINS: none   LOCAL MEDICATIONS USED:  BUPIVICAINE   SPECIMEN:  No Specimen  DISPOSITION OF SPECIMEN:  N/A  COUNTS:  YES  TOURNIQUET:  * No tourniquets in log *  DICTATION: .Dragon Dictation  PLAN OF CARE: Admit for overnight observation  PATIENT DISPOSITION:  PACU - hemodynamically stable.   Delay start of Pharmacological VTE agent (>24hrs) due to surgical blood loss or risk of bleeding: no

## 2021-12-10 NOTE — Transfer of Care (Signed)
Immediate Anesthesia Transfer of Care Note  Patient: Carol Lowery  Procedure(s) Performed: XI ROBOTIC ASSISTED PARAESOPHAGEAL HERNIA REPAIR WITH FUNDOPLICATION (Chest) ESOPHAGOGASTRODUODENOSCOPY (EGD)  Patient Location: PACU  Anesthesia Type:General  Level of Consciousness: awake, alert  and oriented  Airway & Oxygen Therapy: Patient Spontanous Breathing and Patient connected to nasal cannula oxygen  Post-op Assessment: Report given to RN and Post -op Vital signs reviewed and stable  Post vital signs: Reviewed and stable  Last Vitals:  Vitals Value Taken Time  BP 132/68 12/10/21 1153  Temp    Pulse 84 12/10/21 1154  Resp 17 12/10/21 1154  SpO2 94 % 12/10/21 1154  Vitals shown include unvalidated device data.  Last Pain:  Vitals:   12/10/21 0653  TempSrc: Oral  PainSc: 0-No pain      Patients Stated Pain Goal: 0 (12/10/21 4388)  Complications: No notable events documented.

## 2021-12-10 NOTE — Op Note (Incomplete)
      [address]       Carol Lower[place] [address]             [phone]        12/10/2021  Patient:  Carol Lowery Pre-Op Dx: ***   Post-op Dx:  *** Procedure: - Esophagoscopy - Robotic assisted laparoscopy - Paraesophageal hernia repair ***   Surgeon and Role:      * [name], MD - Primary  Assistant: ***, PA-C  An experienced assistant was required given the complexity of this surgery and the standard of surgical care. The assistant was needed for exposure, dissection, suctioning, retraction of delicate tissues and sutures, instrument exchange and for overall help during this procedure.   Anesthesia  general EBL:  ***ml Blood Administration: *** Specimen:  ***   Counts: correct   Indications: *** Findings: Tortuous esophagus.  Difficult to intubate the stomach.  Large hernia.  5 stitches posterior, 1 stitch anterior  Operative Technique: After the risks, benefits and alternatives were thoroughly discussed, the patient was brought to the operative theatre.  Anesthesia was induced, and the esophagoscope was passed through the oropharynx down to the stomach.  The scope was retroflexed and the hiatal hernia was clearly evident.  The scope was pulled back and the mucosal surface of the esophagus was visualized.    ***.  The scope was then parked at 25 cm from the incisors.  The patient was then prepped and draped in normal sterile fashion.  An appropriate surgical pause was performed, and pre-operative antibiotics were dosed accordingly.  We began with a 1 cm incision 15 cm caudad from the xiphoid and slightly lateral to the umbilicus.  Using an Optiview we entered the peritoneal space.  The abdomen was then insufflated with CO2.  3 other robotic ports were placed to triangulate the hiatus.  Another 12 mm port was placed in place at the level of the umbilicus laterally for an assistant port and another 5 mm trocar was placed in the right lower quadrant  for liver retractor.  The patient was then placed in steep reverse Trendelenburg and the liver was elevated to expose the esophageal hiatus.  And then the robot was docked.  We began by dividing the gastrohepatic ligament to expose the right diaphragmatic crus and then dissected the hernia sac in a clockwise fashion to mobilize there the stomach and esophagus.  We then divided the short gastrics and moved towards the right crus and completed our dissection along the esophageal hiatus.  A Penrose drain was then used to encircle the the esophagus and we continued our dissection up into the mediastinum.  Once we had achieved 3 to 4 cm of intra-abdominal esophagus we then proceeded to reapproximate the crura with 0 Ethibond sutures in an interrupted fashion.  ***.  The gastroscope was passed down through the lower esophageal sphincter into the stomach and would act as our bougie during this repair.  ***.  Next the stomach was passed ***to the esophagus and ***fundoplication was performed.  An air leak test was performed using the gastroscope.  No leak was evident.  The liver retractor was removed and all ports were removed under direct visualization.  The skin and soft tissue were closed with absorbable suture    The patient tolerated the procedure without any immediate complications, and was transferred to the PACU in stable condition.  Carol Lowery

## 2021-12-11 ENCOUNTER — Encounter (HOSPITAL_COMMUNITY): Payer: Self-pay | Admitting: Thoracic Surgery (Cardiothoracic Vascular Surgery)

## 2021-12-11 ENCOUNTER — Observation Stay (HOSPITAL_COMMUNITY): Payer: Medicare Other

## 2021-12-11 DIAGNOSIS — K449 Diaphragmatic hernia without obstruction or gangrene: Secondary | ICD-10-CM | POA: Diagnosis not present

## 2021-12-11 MED ORDER — TRAMADOL HCL 50 MG PO TABS: 50.0000 mg | ORAL_TABLET | Freq: Four times a day (QID) | ORAL | 0 refills | Status: DC | PRN

## 2021-12-11 MED ORDER — ONDANSETRON HCL 4 MG PO TABS: 4.0000 mg | ORAL_TABLET | Freq: Three times a day (TID) | ORAL | 0 refills | Status: DC | PRN

## 2021-12-11 NOTE — Care Management Obs Status (Signed)
MEDICARE OBSERVATION STATUS NOTIFICATION   Patient Details  Name: Carol Lowery MRN: [id number] Date of Birth: [birth year]   Medicare Observation Status Notification Given:  Yes    Beckie Busing, RN 12/11/2021, 1:03 PM

## 2021-12-11 NOTE — Progress Notes (Signed)
Mobility Specialist Progress Note    12/11/21 1408  Mobility  Activity Ambulated independently in hallway  Level of Assistance Independent  Assistive Device None  Distance Ambulated (ft) 800 ft  Activity Response Tolerated well  $Mobility charge 1 Mobility   Pt received in doorway and agreeable. No complaints. Continued walking with husband. RN aware.  [place] Mobility Specialist

## 2021-12-11 NOTE — TOC Progression Note (Signed)
Transition of Care [facility] - Progression Note    Patient Details  Name: Kynzee Devinney MRN: [id number] Date of Birth: 23-Nov-1940  Transition of Care [facility]) CM/SW Contact  Beckie Busing, RN Phone [phone]  12/11/2021, 3:50 PM  Clinical Narrative:     Transition of Care (TOC) Screening Note   Patient Details  Name: Talibah O'Hearn Date of Birth: Apr 13, 1941   Transition of Care [facility] CM/SW Contact:    Beckie Busing, RN Phone Number: 12/11/2021, 3:50 PM    Transition of Care Department [facility]) has reviewed patient and no TOC needs have been identified at this time. We will continue to monitor patient advancement through interdisciplinary progression rounds.           Expected Discharge Plan and Services           Expected Discharge Date: 12/11/21                                     Social Determinants of Health (SDOH) Interventions    Readmission Risk Interventions     No data to display

## 2021-12-11 NOTE — Discharge Instructions (Signed)
Please crush all meds and take in applesauce  No Carbonated beverages  All food should be baby food consistency

## 2021-12-11 NOTE — Progress Notes (Signed)
      [address]       [place] [address]             [phone]      1 Day Post-Op Procedure(s) (LRB): XI ROBOTIC ASSISTED PARAESOPHAGEAL HERNIA REPAIR WITH FUNDOPLICATION (N/A) ESOPHAGOGASTRODUODENOSCOPY (EGD) (N/A)  Subjective:  Patient having neck pain and some pain in her upper back.  The patient denies N/V and has refused anti-emetics.  I educated patient on the importance of using anti-emetic so she does not vomit which would stress her surgical anastomosis.  Objective: Vital signs in last 24 hours: Temp:  [98 F (36.7 C)-98.8 F (37.1 C)] 98.8 F (37.1 C) (06/22 0721) Pulse Rate:  [72-91] 86 (06/22 0721) Cardiac Rhythm: Normal sinus rhythm (06/22 0712) Resp:  [13-21] 19 (06/22 0721) BP: (114-141)/(60-86) 141/86 (06/22 0721) SpO2:  [92 %-98 %] 92 % (06/22 0721) Weight:  [65.7 kg] 65.7 kg (06/21 1415)  Intake/Output from previous day: 06/21 0701 - 06/22 0700 In: 1200 [I.V.:1200] Out: 290 [Urine:265; Blood:25]  General appearance: alert, cooperative, and no distress Heart: regular rate and rhythm Lungs: clear to auscultation bilaterally Abdomen: soft, non-tender; bowel sounds normal; no masses,  no organomegaly Extremities: extremities normal, atraumatic, no cyanosis or edema Wound: clean and dry  Lab Results: Recent Labs    12/08/21 1431  WBC 7.4  HGB 13.1  HCT 40.0  PLT 284   BMET:  Recent Labs    12/08/21 1431  NA 140  K 4.2  CL 109  CO2 21*  GLUCOSE 94  BUN 15  CREATININE 0.65  CALCIUM 9.3    PT/INR:  Recent Labs    12/08/21 1431  LABPROT 13.0  INR 1.0   ABG    Component Value Date/Time   PHART 7.49 (H) 12/08/2021 1419   HCO3 22.1 12/08/2021 1419   ACIDBASEDEF 0.2 12/08/2021 1419   O2SAT 99.8 12/08/2021 1419   CBG (last 3)  No results for input(s): "GLUCAP" in the last 72 hours.  Assessment/Plan: S/P Procedure(s) (LRB): XI ROBOTIC ASSISTED PARAESOPHAGEAL HERNIA REPAIR WITH FUNDOPLICATION  (N/A) ESOPHAGOGASTRODUODENOSCOPY (EGD) (N/A)  CV- hemodynamically stable Pulm- no acute issues, off oxygen GI- denies N/V, for esophogram this morning, if no evidence of leak will start soft diet Pain well controlled Dispo- patient stable, for swallow today, if no leak will start soft diet, if tolerates, plan for d/c later today   LOS: 1 day    Lowella Dandy, PA-C 12/11/2021

## 2021-12-19 ENCOUNTER — Ambulatory Visit (INDEPENDENT_AMBULATORY_CARE_PROVIDER_SITE_OTHER): Payer: Self-pay | Admitting: Thoracic Surgery (Cardiothoracic Vascular Surgery)

## 2021-12-19 DIAGNOSIS — Z8719 Personal history of other diseases of the digestive system: Secondary | ICD-10-CM

## 2021-12-19 DIAGNOSIS — Z9889 Other specified postprocedural states: Secondary | ICD-10-CM

## 2021-12-19 NOTE — Progress Notes (Signed)
     [address]       [place] [address]             [phone]       Patient: Home Provider: Office Consent for Telemedicine visit obtained.  Today's visit was completed via a real-time telehealth (see specific modality noted below). The patient/authorized person provided oral consent at the time of the visit to engage in a telemedicine encounter with the present provider at [facility]. The patient/authorized person was informed of the potential benefits, limitations, and risks of telemedicine. The patient/authorized person expressed understanding that the laws that protect confidentiality also apply to telemedicine. The patient/authorized person acknowledged understanding that telemedicine does not provide emergency services and that he or she would need to call 911 or proceed to the nearest hospital for help if such a need arose.   Total time spent in the clinical discussion 10 minutes.  Telehealth Modality: Phone visit (audio only)  I had a telephone visit with Mrs. O'Hearn.  She is s/p PEH repair.  Overall she is doing well.  She is ready to advance her diet.  She will f/u in 1 month with a CXR.

## 2022-01-14 ENCOUNTER — Other Ambulatory Visit: Payer: Self-pay | Admitting: Thoracic Surgery (Cardiothoracic Vascular Surgery)

## 2022-01-14 DIAGNOSIS — Z8719 Personal history of other diseases of the digestive system: Secondary | ICD-10-CM

## 2022-01-15 NOTE — Progress Notes (Signed)
      [address]       [place] [address]             [phone]        [facility] Medical Record #[id number] Date of Birth: [birth year]  Referring: Shon Hale, * Primary Care: Shon Hale, MD Primary Cardiologist:Philip Nahser, MD  Reason for visit:   follow-up  History of Present Illness:     81 yo female presents for 1 month follow-up after undergoing a PEH repair.  Overall she is doing well.  She denies any reflux.  She has some random dysphagia.  It is not associated with any specific meals or fluids.  Physical Exam: BP (!) 150/73   Pulse 90   Resp 20   Ht 5' (1.524 m)   Wt 130 lb (59 kg)   SpO2 98%   BMI 25.39 kg/m   Alert NAD Incision well-healed.   Abdomen, ND No peripheral edema   Diagnostic Studies & Laboratory data: CXR: Clear     Assessment / Plan:   81yo female s/p PEH repair with Dor Fundoplication.  Will follow-up in 3 months.  I instructed her to call us back if she continues to have some dysphagia after a month.  If that occurs she will need a repeat swallow study.   [name] 01/16/2022 9:37 PM

## 2022-01-16 ENCOUNTER — Ambulatory Visit: Payer: Self-pay | Admitting: Thoracic Surgery (Cardiothoracic Vascular Surgery)

## 2022-01-16 ENCOUNTER — Ambulatory Visit
Admission: RE | Admit: 2022-01-16 | Discharge: 2022-01-16 | Disposition: A | Discharge status: Home / Self Care | Payer: Medicare Other | Source: Ambulatory Visit | Attending: Thoracic Surgery (Cardiothoracic Vascular Surgery) | Admitting: Thoracic Surgery (Cardiothoracic Vascular Surgery)

## 2022-01-16 ENCOUNTER — Ambulatory Visit (INDEPENDENT_AMBULATORY_CARE_PROVIDER_SITE_OTHER): Payer: Self-pay | Admitting: Thoracic Surgery (Cardiothoracic Vascular Surgery)

## 2022-01-16 VITALS — BP 150/73 | HR 90 | Resp 20 | Ht 60.0 in | Wt 130.0 lb

## 2022-01-16 DIAGNOSIS — K449 Diaphragmatic hernia without obstruction or gangrene: Secondary | ICD-10-CM

## 2022-01-16 DIAGNOSIS — Z9889 Other specified postprocedural states: Secondary | ICD-10-CM

## 2022-01-16 DIAGNOSIS — Z8719 Personal history of other diseases of the digestive system: Secondary | ICD-10-CM

## 2022-02-05 IMAGING — MG MM DIGITAL SCREENING BILAT W/ TOMO AND CAD
8 series · 8 of 24 positions shown · non-contrast
Comparison: Previous exam(s).

CLINICAL DATA: Screening.

EXAM:
DIGITAL SCREENING BILATERAL MAMMOGRAM WITH TOMOSYNTHESIS AND CAD
TECHNIQUE: Bilateral screening digital craniocaudal and mediolateral oblique
mammograms were obtained. Bilateral screening digital breast
tomosynthesis was performed. The images were evaluated with
computer-aided detection.

[L CC synth-2D]
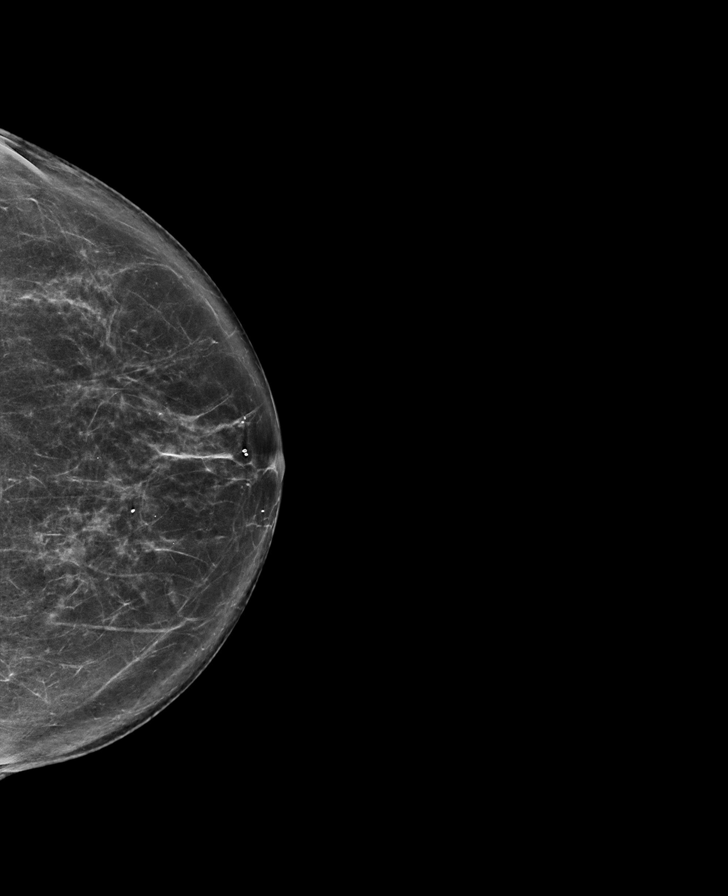

[R CC synth-2D]
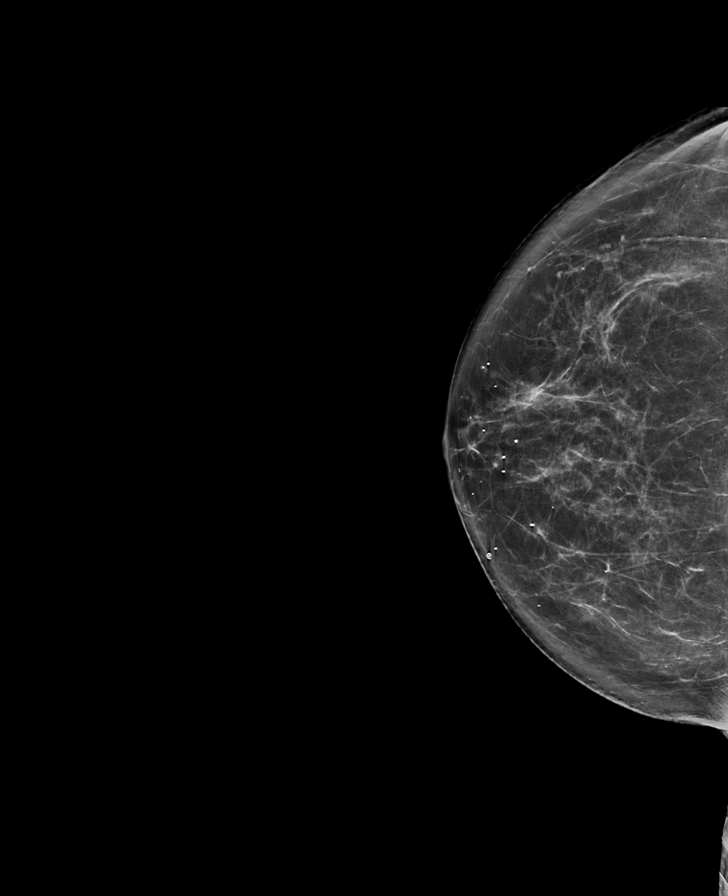

[L MLO synth-2D]
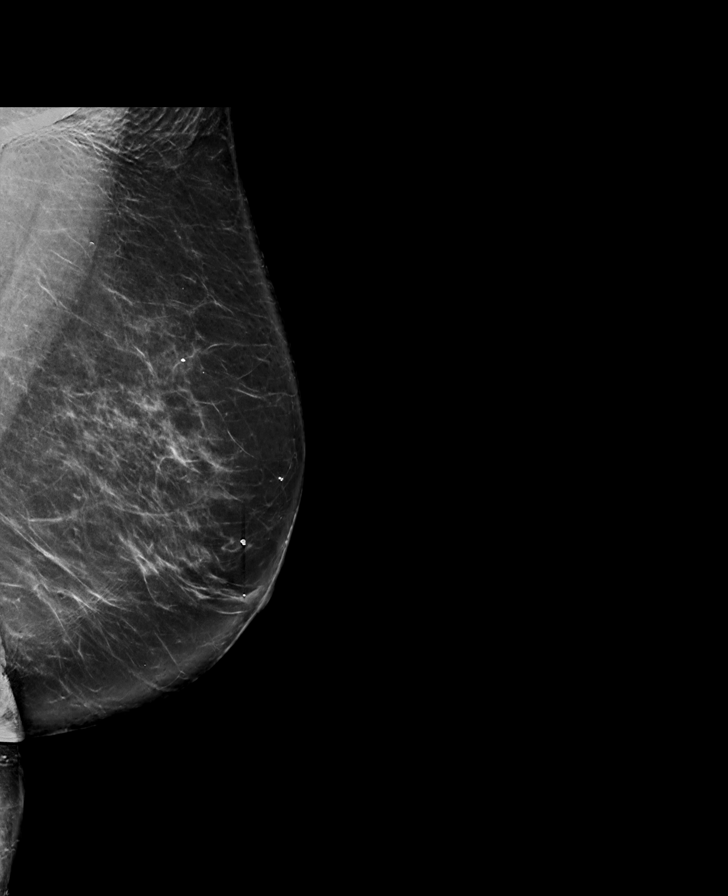

[R MLO synth-2D]
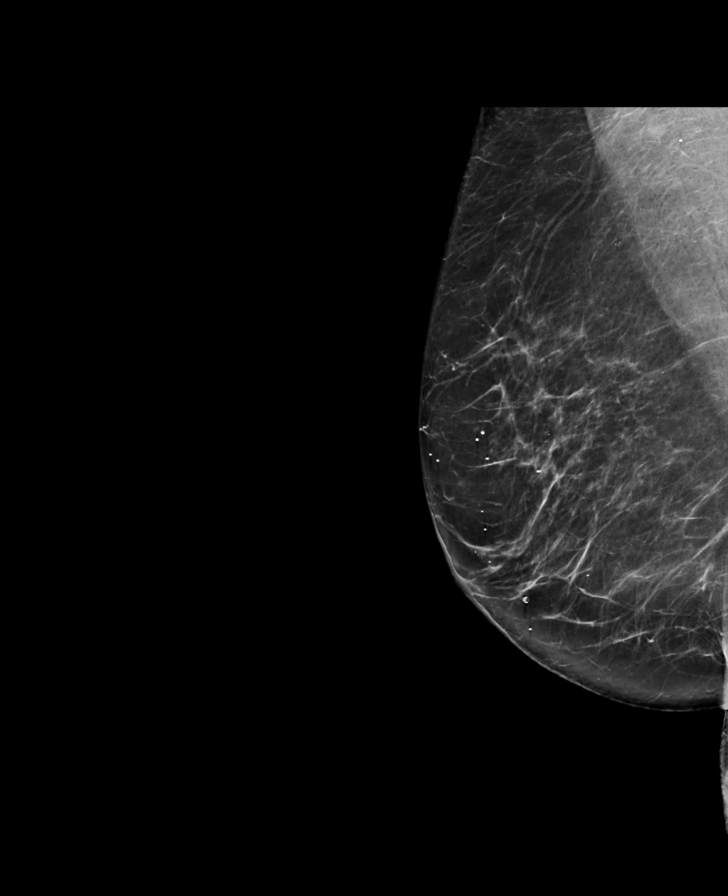

[L CC tomo · tomo slice 41/80.0]
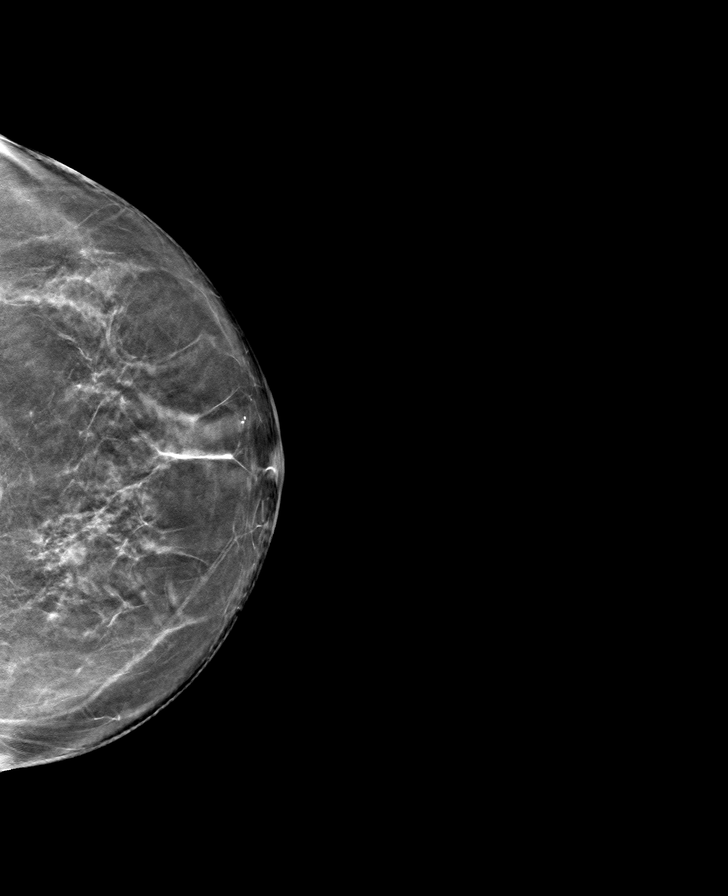

[R MLO tomo · tomo slice 41/82.0]
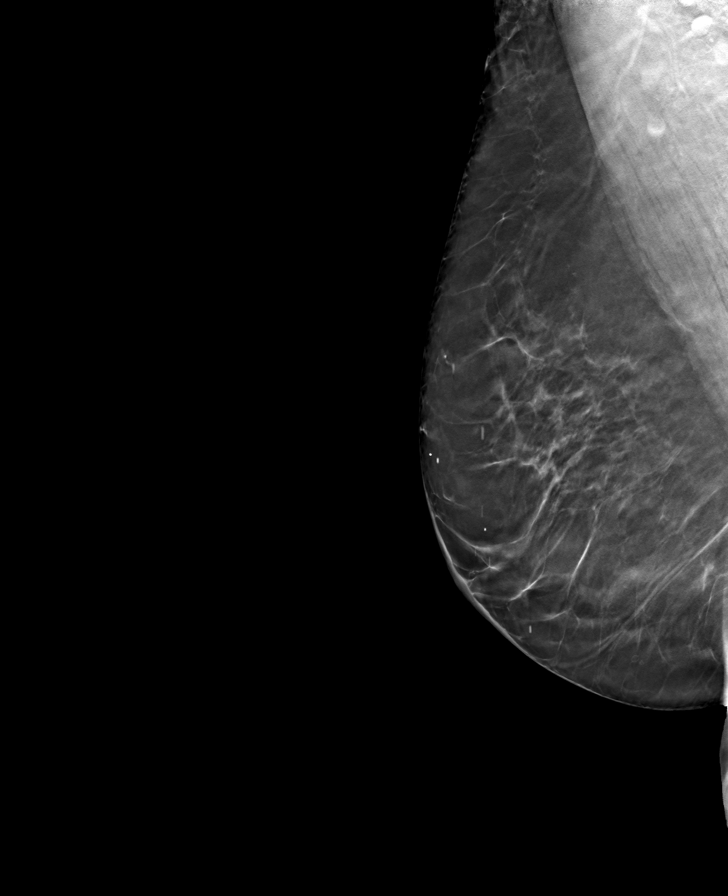

[R CC tomo · tomo slice 41/80.0]
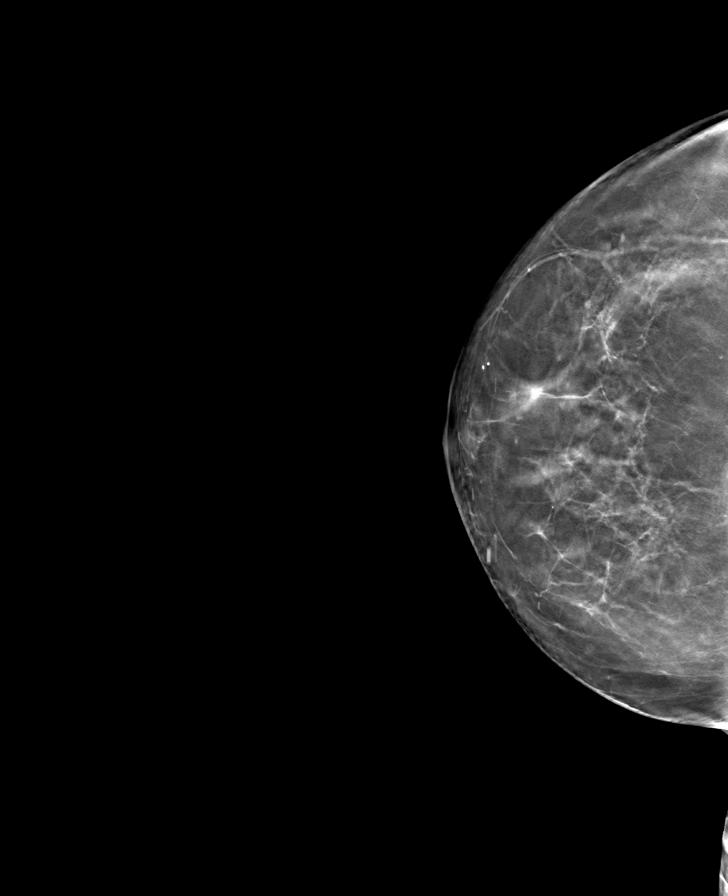

[L MLO tomo · tomo slice 45/89.0]
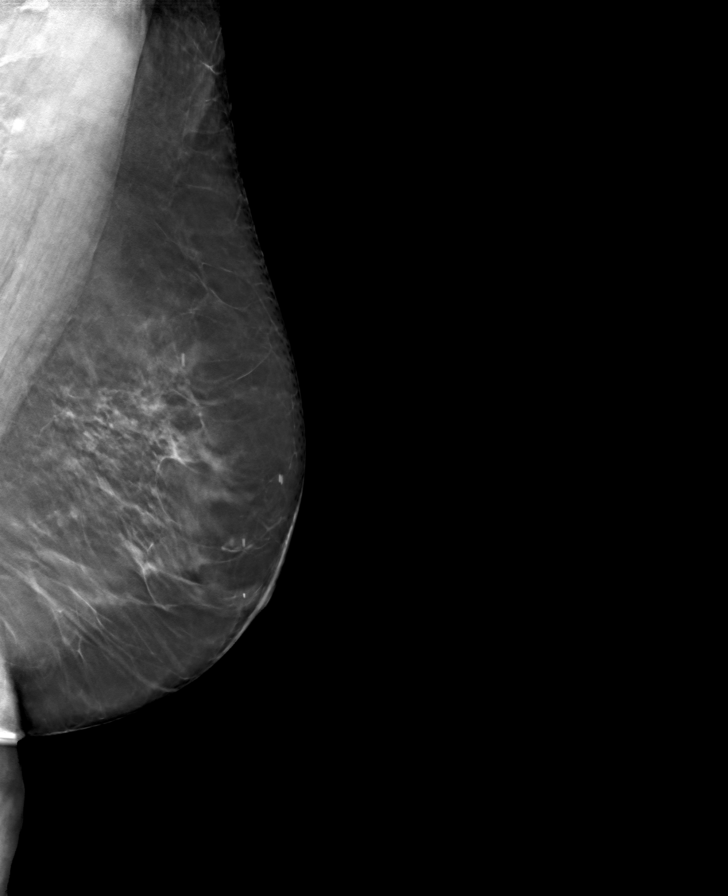

[8 of 24 positions shown; findings below may reference images not displayed]

ACR Breast Density Category b: There are scattered areas of
fibroglandular density.
FINDINGS: There are no findings suspicious for malignancy.
IMPRESSION: No mammographic evidence of malignancy. A result letter of this
screening mammogram will be mailed directly to the patient.

RECOMMENDATION:
Screening mammogram in one year. (Code:51-O-LD2)

BI-RADS CATEGORY  1: Negative.

## 2022-04-09 ENCOUNTER — Ambulatory Visit (INDEPENDENT_AMBULATORY_CARE_PROVIDER_SITE_OTHER): Payer: Medicare Other | Admitting: Thoracic Surgery (Cardiothoracic Vascular Surgery)

## 2022-04-09 DIAGNOSIS — R142 Eructation: Secondary | ICD-10-CM

## 2022-04-09 DIAGNOSIS — Z8719 Personal history of other diseases of the digestive system: Secondary | ICD-10-CM | POA: Diagnosis not present

## 2022-04-09 NOTE — Progress Notes (Signed)
     [address]       [place] [address]             [phone]       Patient: Home Provider: Office Consent for Telemedicine visit obtained.  Today's visit was completed via a real-time telehealth (see specific modality noted below). The patient/authorized person provided oral consent at the time of the visit to engage in a telemedicine encounter with the present provider at [facility]. The patient/authorized person was informed of the potential benefits, limitations, and risks of telemedicine. The patient/authorized person expressed understanding that the laws that protect confidentiality also apply to telemedicine. The patient/authorized person acknowledged understanding that telemedicine does not provide emergency services and that he or she would need to call 911 or proceed to the nearest hospital for help if such a need arose.   Total time spent in the clinical discussion 10 minutes.  Telehealth Modality: Phone visit (audio only)  I had a telephone visit with Carol Lowery.  Only complains of some belching.  Her swallowing is 85% better.  Otherwise she is doing well.  She will follow-up as needed.  Lloyd Ayo Bary Leriche

## 2022-04-17 ENCOUNTER — Ambulatory Visit: Payer: Medicare Other | Admitting: Thoracic Surgery (Cardiothoracic Vascular Surgery)

## 2022-06-17 ENCOUNTER — Other Ambulatory Visit: Payer: Self-pay | Admitting: Family Medicine

## 2022-06-17 DIAGNOSIS — Z1231 Encounter for screening mammogram for malignant neoplasm of breast: Secondary | ICD-10-CM

## 2022-08-06 ENCOUNTER — Ambulatory Visit
Admission: RE | Admit: 2022-08-06 | Discharge: 2022-08-06 | Disposition: A | Discharge status: Home / Self Care | Payer: Medicare Other | Source: Ambulatory Visit | Attending: Family Medicine | Admitting: Family Medicine

## 2022-08-06 DIAGNOSIS — Z1231 Encounter for screening mammogram for malignant neoplasm of breast: Secondary | ICD-10-CM

## 2022-08-13 ENCOUNTER — Encounter: Payer: Self-pay | Admitting: Cardiovascular Disease

## 2022-08-13 NOTE — Progress Notes (Signed)
Cardiology Office Note:    Date:  08/14/2022   ID:  Carol Lowery, DOB 06-23-1940, MRN [id number]  PCP:  Glenis Smoker, MD  [facility] Cardiologist:  Mertie Moores, MD  [place] Electrophysiologist:  None   Referring MD: Glenis Smoker, *   Chief Complaint  Patient presents with   patent foramen ovale   Hyperlipidemia    Feb. 4, 2022   Carol Lowery is a 82 y.o. female with a hx of PFO.  She followed with Dr. Adella Nissen in [place], [place]  [phone])  She follwed with Dr.Gladstone yearly   No Cp or dyspnea.  Exercises regularly ,  10,000 steps a day , 5- miles on stationary bike a day  No CP , no dyspnea,  No syncope  Her PFO was diagnosed by echo ( after her ophthamologist found an abn. )  She also has a history of hyperlipidemia and is on Lipitor.  She has a history of hypothyroidism and takes Synthroid.  She takes aspirin 81 mg a day.   Feb. 21, 2023 [name] is seen today for follow up of her PFO Had [place] in Dec.  Has had dyspnea  Has been short of breath since Has a large hiatal hernia  that needs repair    Feb. 23, 2024 [name] is seen today for follow up of her PFO, Has had her hiatal hernia repaired Has recovered well   She does have some generalized aches and pains and is being tested for rheumatoid arthritis.  She is on atorvastatin.  Her lipid levels remain well-controlled.  She question whether or not she actually needs the atorvastatin.  I will give her the okay to stop her atorvastatin for several weeks to see if her aches and pains improve.  If it turns out that her aches and pains are due to rheumatoid arthritis then do not know that stopping the atorvastatin would necessarily help her.   Past Medical History:  Diagnosis Date   Anemia    Arthritis    generalized   Asthma    cough varient asthma;seasonal   GERD (gastroesophageal reflux disease)    Heart murmur    as a child and while pregnant   Hiatal hernia     Hypothyroidism    Patent foramen ovale    TEE 08/14/15 ([facility]): LVEF 55-60%, no RWMA, PFO with minimal right to left shunt, no ASD, mild MR, small fibrinous pericardial effsuion   Renal cyst    per patient benign   Thyroid disease     Past Surgical History:  Procedure Laterality Date   ABDOMINAL HYSTERECTOMY     APPENDECTOMY     appendectomy of left toe     COLONOSCOPY     DIAGNOSTIC LAPAROSCOPY     bilateral knees   ESOPHAGOGASTRODUODENOSCOPY N/A 12/10/2021   Procedure: ESOPHAGOGASTRODUODENOSCOPY (EGD);  Surgeon: Lajuana Matte, MD;  Location: [facility] OR;  Service: Thoracic;  Laterality: N/A;   EYE SURGERY     cataract removed bilaterally   JOINT REPLACEMENT     knee replacement left Left    RADICAL VAGINAL HYSTERECTOMY     right carpal tunnel release     TONSILLECTOMY     tonsils removed     UPPER GASTROINTESTINAL ENDOSCOPY     XI ROBOTIC ASSISTED PARAESOPHAGEAL HERNIA REPAIR N/A 12/10/2021   Procedure: XI ROBOTIC ASSISTED PARAESOPHAGEAL HERNIA REPAIR WITH FUNDOPLICATION;  Surgeon: Lajuana Matte, MD;  Location: [facility];  Service: Thoracic;  Laterality: N/A;  Current Medications: Current Meds  Medication Sig   aspirin EC 81 MG tablet Take 81 mg by mouth daily.   atorvastatin (LIPITOR) 10 MG tablet Take 10 mg by mouth daily.   Biotin 2500 MCG CHEW Chew 5,000 mcg by mouth daily.   Calcium-Vitamin D-Vitamin K 650-12.5-40 MG-MCG-MCG CHEW Chew 2 each by mouth daily.   levothyroxine (SYNTHROID) 75 MCG tablet Take 75 mcg by mouth daily before breakfast.   Multiple Vitamins-Minerals (WOMENS 50+ MULTI VITAMIN/MIN) TABS Take 2 tablets by mouth daily.   Propylene Glycol (SYSTANE BALANCE) 0.6 % SOLN Place 1 drop into both eyes every morning.     Allergies:   Sulfa antibiotics, Velosef [cephradine], Garlic, and Other   Social History   Socioeconomic History   Marital status: Married    Spouse name: Not on file   Number of children: 3   Years of education: Not  on file   Highest education level: Not on file  Occupational History   Occupation: retired  Tobacco Use   Smoking status: Never   Smokeless tobacco: Never  Vaping Use   Vaping Use: Never used  Substance and Sexual Activity   Alcohol use: Never   Drug use: Never   Sexual activity: Not on file  Other Topics Concern   Not on file  Social History Narrative   Not on file   Social Determinants of Health   Financial Resource Strain: Not on file  Food Insecurity: Not on file  Transportation Needs: Not on file  Physical Activity: Not on file  Stress: Not on file  Social Connections: Not on file     Family History: The patient's family history includes Bone cancer in her father; Brain cancer in her mother; Colon cancer in her father; High blood pressure in her brother; Liver cancer in her father; Lung cancer in her father; Raynaud syndrome in her father.  ROS:   Please see the history of present illness.     All other systems reviewed and are negative.  EKGs/Labs/Other Studies Reviewed:    The following studies were reviewed today:   EKG:    Feb. 23, 2024:  NSR at 78.   Normal ecg    Recent Labs: 12/08/2021: ALT 22; BUN 15; Creatinine, Ser 0.65; Hemoglobin 13.1; Platelets 284; Potassium 4.2; Sodium 140  Recent Lipid Panel No results found for: "CHOL", "TRIG", "HDL", "CHOLHDL", "VLDL", "LDLCALC", "LDLDIRECT"   Risk Assessment/Calculations:       Physical Exam:    Physical Exam: Blood pressure 132/76, pulse 78, height 5' (1.524 m), weight 131 lb (59.4 kg), SpO2 98 %.       GEN:  Well nourished, well developed in no acute distress HEENT: Normal NECK: No JVD; No carotid bruits LYMPHATICS: No lymphadenopathy CARDIAC: RRR , no murmurs, rubs, gallops RESPIRATORY:  Clear to auscultation without rales, wheezing or rhonchi  ABDOMEN: Soft, non-tender, non-distended MUSCULOSKELETAL:  No edema; No deformity  SKIN: Warm and dry NEUROLOGIC:  Alert and oriented x  3   ASSESSMENT:    No diagnosis found.   PLAN:      Patent foramen ovale: asymptomatic    2.  Episodes of sinus tach.    3.  Dyspnea:   stable,  able to exercise    Medication Adjustments/Labs and Tests Ordered: Current medicines are reviewed at length with the patient today.  Concerns regarding medicines are outlined above.  No orders of the defined types were placed in this encounter.  No orders of the defined types  were placed in this encounter.   There are no Patient Instructions on file for this visit.   Signed, Mertie Moores, MD  08/14/2022 2:26 PM    [place]

## 2022-08-14 ENCOUNTER — Encounter: Payer: Self-pay | Admitting: Cardiovascular Disease

## 2022-08-14 ENCOUNTER — Ambulatory Visit: Payer: Medicare Other | Attending: Cardiovascular Disease | Admitting: Cardiovascular Disease

## 2022-08-14 VITALS — BP 132/76 | HR 78 | Ht 60.0 in | Wt 131.0 lb

## 2022-08-14 DIAGNOSIS — E782 Mixed hyperlipidemia: Secondary | ICD-10-CM | POA: Diagnosis present

## 2022-08-14 NOTE — Patient Instructions (Signed)
Medication Instructions:  Your physician recommends that you continue on your current medications as directed. Please refer to the Current Medication list given to you today.  *If you need a refill on your cardiac medications before your next appointment, please call your pharmacy*   Lab Work: NONE If you have labs (blood work) drawn today and your tests are completely normal, you will receive your results only by: [place] (if you have MyChart) OR A paper copy in the mail If you have any lab test that is abnormal or we need to change your treatment, we will call you to review the results.   Testing/Procedures: NONE   Follow-Up: At [facility], you and your health needs are our priority.  As part of our continuing mission to provide you with exceptional heart care, we have created designated Provider Care Teams.  These Care Teams include your primary Cardiologist (physician) and Advanced Practice Providers (APPs -  Physician Assistants and Nurse Practitioners) who all work together to provide you with the care you need, when you need it.  We recommend signing up for the patient portal called "MyChart".  Sign up information is provided on this After Visit Summary.  MyChart is used to connect with patients for Virtual Visits (Telemedicine).  Patients are able to view lab/test results, encounter notes, upcoming appointments, etc.  Non-urgent messages can be sent to your provider as well.   To learn more about what you can do with MyChart, go to [email or web address].    Your next appointment:   1 year(s)  Provider:   Mertie Moores, MD

## 2023-01-20 ENCOUNTER — Other Ambulatory Visit: Payer: Self-pay | Admitting: Family Medicine

## 2023-01-20 DIAGNOSIS — R1011 Right upper quadrant pain: Secondary | ICD-10-CM

## 2023-01-27 ENCOUNTER — Ambulatory Visit
Admission: RE | Admit: 2023-01-27 | Discharge: 2023-01-27 | Disposition: A | Discharge status: Home / Self Care | Payer: Medicare Other | Source: Ambulatory Visit | Attending: Family Medicine | Admitting: Family Medicine

## 2023-01-27 DIAGNOSIS — R1011 Right upper quadrant pain: Secondary | ICD-10-CM

## 2023-01-29 ENCOUNTER — Other Ambulatory Visit: Payer: Medicare Other

## 2023-03-05 ENCOUNTER — Other Ambulatory Visit: Payer: Self-pay | Admitting: Thoracic Surgery (Cardiothoracic Vascular Surgery)

## 2023-03-05 ENCOUNTER — Ambulatory Visit (INDEPENDENT_AMBULATORY_CARE_PROVIDER_SITE_OTHER): Payer: Medicare Other | Admitting: Thoracic Surgery (Cardiothoracic Vascular Surgery)

## 2023-03-05 VITALS — BP 146/77 | HR 77 | Resp 18 | Ht 60.0 in | Wt 128.0 lb

## 2023-03-05 DIAGNOSIS — K449 Diaphragmatic hernia without obstruction or gangrene: Secondary | ICD-10-CM

## 2023-03-05 DIAGNOSIS — Z8719 Personal history of other diseases of the digestive system: Secondary | ICD-10-CM | POA: Diagnosis not present

## 2023-03-05 DIAGNOSIS — Z9889 Other specified postprocedural states: Secondary | ICD-10-CM | POA: Diagnosis not present

## 2023-03-07 NOTE — Progress Notes (Signed)
[address]       [place] [address]             [phone]        [facility] Medical Record #[id number] Date of Birth: Oct 31, 1940  Referring: Shon Hale, * Primary Care: Shon Hale, MD Primary Cardiologist:Philip Nahser, MD  Reason for visit:   follow-up  History of Present Illness:     82yo female presents to discuss new symptoms following her PEH repair.  She has had increase belching on a daily basis.  She denies any reflux.  She occasionally has some dysphagia, but this is not new since surgery.  Physical Exam: BP (!) 146/77 (BP Location: Left Arm, Patient Position: Sitting)   Pulse 77   Resp 18   Ht 5' (1.524 m)   Wt 128 lb (58.1 kg)   SpO2 96% Comment: RA  BMI 25.00 kg/m   Alert NAD Abdomen, ND no peripheral edema       Assessment / Plan:   82yo female s/p PEH repair with fundoplication in June of 2023 presents with new onset belching with meals.  I have ordered a esophagram for further evaluation.   [name] 03/07/2023 11:17 AM

## 2023-03-23 ENCOUNTER — Ambulatory Visit
Admission: RE | Admit: 2023-03-23 | Discharge: 2023-03-23 | Disposition: A | Discharge status: Home / Self Care | Payer: Medicare Other | Source: Ambulatory Visit | Attending: Thoracic Surgery (Cardiothoracic Vascular Surgery)

## 2023-03-23 DIAGNOSIS — K449 Diaphragmatic hernia without obstruction or gangrene: Secondary | ICD-10-CM

## 2023-03-26 ENCOUNTER — Ambulatory Visit: Payer: Medicare Other | Admitting: Thoracic Surgery (Cardiothoracic Vascular Surgery)

## 2023-03-26 ENCOUNTER — Encounter: Payer: Self-pay | Admitting: *Deleted

## 2023-03-26 DIAGNOSIS — Z8719 Personal history of other diseases of the digestive system: Secondary | ICD-10-CM

## 2023-03-26 NOTE — Progress Notes (Signed)
     [address]       [place] [address]             [phone]       Patient: Home Provider: Office Consent for Telemedicine visit obtained.  Today's visit was completed via a real-time telehealth (see specific modality noted below). The patient/authorized person provided oral consent at the time of the visit to engage in a telemedicine encounter with the present provider at [facility]. The patient/authorized person was informed of the potential benefits, limitations, and risks of telemedicine. The patient/authorized person expressed understanding that the laws that protect confidentiality also apply to telemedicine. The patient/authorized person acknowledged understanding that telemedicine does not provide emergency services and that he or she would need to call 911 or proceed to the nearest hospital for help if such a need arose.   Total time spent in the clinical discussion 10 minutes.  Telehealth Modality: Phone visit (audio only)  I had a telephone visit with Mrs. O'Hearn.  Her swallow was reviewed.  I have scheduled her for and EGD with dilation, and possible botox injection to the pylorus.  Dazaria Macneill Keane Scrape

## 2023-03-26 NOTE — H&P (View-Only) (Signed)
     [address]       [place] [address]             [phone]       Patient: Home Provider: Office Consent for Telemedicine visit obtained.  Today's visit was completed via a real-time telehealth (see specific modality noted below). The patient/authorized person provided oral consent at the time of the visit to engage in a telemedicine encounter with the present provider at [facility]. The patient/authorized person was informed of the potential benefits, limitations, and risks of telemedicine. The patient/authorized person expressed understanding that the laws that protect confidentiality also apply to telemedicine. The patient/authorized person acknowledged understanding that telemedicine does not provide emergency services and that he or she would need to call 911 or proceed to the nearest hospital for help if such a need arose.   Total time spent in the clinical discussion 10 minutes.  Telehealth Modality: Phone visit (audio only)  I had a telephone visit with Mrs. O'Hearn.  Her swallow was reviewed.  I have scheduled her for and EGD with dilation, and possible botox injection to the pylorus.  Dazaria Macneill Keane Scrape

## 2023-03-29 ENCOUNTER — Other Ambulatory Visit: Payer: Self-pay | Admitting: *Deleted

## 2023-03-29 DIAGNOSIS — Z8719 Personal history of other diseases of the digestive system: Secondary | ICD-10-CM

## 2023-03-29 DIAGNOSIS — Z5181 Encounter for therapeutic drug level monitoring: Secondary | ICD-10-CM

## 2023-04-08 NOTE — Progress Notes (Signed)
Surgical Instructions   Your procedure is scheduled on Monday 04/19/23.  Report to [facility] Main Entrance "A" at 08:30 A.M., then check in with the Admitting office. Any questions or running late day of surgery: call [phone]  Questions prior to your surgery date: call [phone], Monday-Friday, 8am-4pm. If you experience any cold or flu symptoms such as cough, fever, chills, shortness of breath, etc. between now and your scheduled surgery, please notify us at the above number.     Remember:  Do not eat or drink after midnight the night before your surgery    Take these medicines the morning of surgery with A SIP OF WATER   Levothyroxine (SYNTHROID)   Propylene Glycol (SYSTANE BALANCE)    One week prior to surgery, STOP taking any Aspirin (unless otherwise instructed by your surgeon) Aleve, Naproxen, Ibuprofen, Motrin, Advil, Goody's, BC's, all herbal medications, fish oil, and non-prescription vitamins.                     Do NOT Smoke (Tobacco/Vaping) for 24 hours prior to your procedure.  If you use a CPAP at night, you may bring your mask/headgear for your overnight stay.   You will be asked to remove any contacts, glasses, piercing's, hearing aid's, dentures/partials prior to surgery. Please bring cases for these items if needed.    Patients discharged the day of surgery will not be allowed to drive home, and someone needs to stay with them for 24 hours.  SURGICAL WAITING ROOM VISITATION Patients may have no more than 2 support people in the waiting area - these visitors may rotate.   Pre-op nurse will coordinate an appropriate time for 1 ADULT support person, who may not rotate, to accompany patient in pre-op.  Children under the age of 61 must have an adult with them who is not the patient and must remain in the main waiting area with an adult.  If the patient needs to stay at the hospital during part of their recovery, the visitor guidelines for inpatient rooms  apply.  Please refer to the [facility] website for the visitor guidelines for any additional information.   If you received a COVID test during your pre-op visit  it is requested that you wear a mask when out in public, stay away from anyone that may not be feeling well and notify your surgeon if you develop symptoms. If you have been in contact with anyone that has tested positive in the last 10 days please notify you surgeon.      Pre-operative CHG Bathing Instructions   You can play a key role in reducing the risk of infection after surgery. Your skin needs to be as free of germs as possible. You can reduce the number of germs on your skin by washing with CHG (chlorhexidine gluconate) soap before surgery. CHG is an antiseptic soap that kills germs and continues to kill germs even after washing.   DO NOT use if you have an allergy to chlorhexidine/CHG or antibacterial soaps. If your skin becomes reddened or irritated, stop using the CHG and notify one of our RNs at [phone].              TAKE A SHOWER THE NIGHT BEFORE SURGERY AND THE DAY OF SURGERY    Please keep in mind the following:  DO NOT shave, including legs and underarms, 48 hours prior to surgery.   You may shave your face before/day of surgery.  Place clean sheets  on your bed the night before surgery Use a clean washcloth (not used since being washed) for each shower. DO NOT sleep with pet's night before surgery.  CHG Shower Instructions:  Wash your face and private area with normal soap. If you choose to wash your hair, wash first with your normal shampoo.  After you use shampoo/soap, rinse your hair and body thoroughly to remove shampoo/soap residue.  Turn the water OFF and apply half the bottle of CHG soap to a CLEAN washcloth.  Apply CHG soap ONLY FROM YOUR NECK DOWN TO YOUR TOES (washing for 3-5 minutes)  DO NOT use CHG soap on face, private areas, open wounds, or sores.  Pay special attention to the area where  your surgery is being performed.  If you are having back surgery, having someone wash your back for you may be helpful. Wait 2 minutes after CHG soap is applied, then you may rinse off the CHG soap.  Pat dry with a clean towel  Put on clean pajamas    Additional instructions for the day of surgery: DO NOT APPLY any lotions, deodorants, cologne, or perfumes.   Do not wear jewelry or makeup Do not wear nail polish, gel polish, artificial nails, or any other type of covering on natural nails (fingers and toes) Do not bring valuables to the hospital. [facility] is not responsible for valuables/personal belongings. Put on clean/comfortable clothes.  Please brush your teeth.  Ask your nurse before applying any prescription medications to the skin.

## 2023-04-09 ENCOUNTER — Encounter (HOSPITAL_COMMUNITY): Payer: Self-pay

## 2023-04-09 ENCOUNTER — Other Ambulatory Visit: Payer: Self-pay

## 2023-04-09 ENCOUNTER — Encounter (HOSPITAL_COMMUNITY)
Admission: RE | Admit: 2023-04-09 | Discharge: 2023-04-09 | Disposition: A | Discharge status: Home / Self Care | Payer: Medicare Other | Source: Ambulatory Visit | Attending: Thoracic Surgery (Cardiothoracic Vascular Surgery) | Admitting: Thoracic Surgery (Cardiothoracic Vascular Surgery)

## 2023-04-09 DIAGNOSIS — Z8719 Personal history of other diseases of the digestive system: Secondary | ICD-10-CM | POA: Insufficient documentation

## 2023-04-09 DIAGNOSIS — Z9889 Other specified postprocedural states: Secondary | ICD-10-CM | POA: Diagnosis not present

## 2023-04-09 DIAGNOSIS — Z5181 Encounter for therapeutic drug level monitoring: Secondary | ICD-10-CM | POA: Insufficient documentation

## 2023-04-09 DIAGNOSIS — Z01818 Encounter for other preprocedural examination: Secondary | ICD-10-CM | POA: Diagnosis present

## 2023-04-09 DIAGNOSIS — Z01812 Encounter for preprocedural laboratory examination: Secondary | ICD-10-CM | POA: Diagnosis not present

## 2023-04-09 HISTORY — DX: Other complications of anesthesia, initial encounter: T88.59XA

## 2023-04-09 HISTORY — DX: Headache, unspecified: R51.9

## 2023-04-09 LAB — CBC
HCT: 39.2 % (ref 36.0–46.0)
Hemoglobin: 12.9 g/dL (ref 12.0–15.0)
MCH: 28.9 pg (ref 26.0–34.0)
MCHC: 32.9 g/dL (ref 30.0–36.0)
MCV: 87.7 fL (ref 80.0–100.0)
Platelets: 300 10*3/uL (ref 150–400)
RBC: 4.47 MIL/uL (ref 3.87–5.11)
RDW: 13.4 % (ref 11.5–15.5)
WBC: 7.8 10*3/uL (ref 4.0–10.5)
nRBC: 0 % (ref 0.0–0.2)

## 2023-04-09 LAB — COMPREHENSIVE METABOLIC PANEL
ALT: 16 U/L (ref 0–44)
AST: 20 U/L (ref 15–41)
Albumin: 3.9 g/dL (ref 3.5–5.0)
Alkaline Phosphatase: 56 U/L (ref 38–126)
Anion gap: 12 (ref 5–15)
BUN: 19 mg/dL (ref 8–23)
CO2: 24 mmol/L (ref 22–32)
Calcium: 9.8 mg/dL (ref 8.9–10.3)
Chloride: 102 mmol/L (ref 98–111)
Creatinine, Ser: 0.73 mg/dL (ref 0.44–1.00)
GFR, Estimated: >60 mL/min (ref >60)
Glucose, Bld: 88 mg/dL (ref 70–99)
Potassium: 4.2 mmol/L (ref 3.5–5.1)
Sodium: 138 mmol/L (ref 135–145)
Total Bilirubin: 0.6 mg/dL (ref 0.3–1.2)
Total Protein: 6.6 g/dL (ref 6.5–8.1)

## 2023-04-09 LAB — SURGICAL PCR SCREEN

## 2023-04-09 LAB — PROTIME-INR
INR: 1 (ref 0.8–1.2)
Prothrombin Time: 13 s (ref 11.4–15.2)

## 2023-04-09 LAB — APTT: aPTT: 29 s (ref 24–36)

## 2023-04-09 NOTE — Plan of Care (Signed)
CHL Tonsillectomy/Adenoidectomy, Postoperative PEDS care plan entered in error.

## 2023-04-09 NOTE — Progress Notes (Addendum)
PCP - Dr. Garth Bigness  Cardiologist - Dr Aneta Mins  EP-no  Endocrine-no  Pulm-no  Chest x-ray - DOS-   EKG - 08/14/22  Stress Test - no  ECHO - 08/30/15  Cardiac Cath - no  AICD-no PM-no LOOP- no  Nerve Stimulator-no  Sleep Study - no CPAP - no  LABS-CBC, CMP, PT, PTT, PCR  ASA-Do not take the am of surgery  ERAS-no  HA1C-na GLP-1-na Fasting Blood Sugar - na Checks Blood Sugar __0___ times a day  Anesthesia-  Pt denies having chest pain, sob, or fever at this time. All instructions explained to the pt, with a verbal understanding of the material. Pt agrees to go over the instructions while at home for a better understanding. The opportunity to ask questions was provided.

## 2023-04-12 NOTE — Progress Notes (Signed)
Invalid Surgical PCR result at pre-op appointment. Will need to be recollected DOS. Order placed.

## 2023-04-15 ENCOUNTER — Other Ambulatory Visit (HOSPITAL_COMMUNITY): Payer: Medicare Other

## 2023-04-19 ENCOUNTER — Ambulatory Visit (HOSPITAL_COMMUNITY): Payer: Medicare Other

## 2023-04-19 ENCOUNTER — Encounter (HOSPITAL_COMMUNITY)
Admission: RE | Disposition: A | Discharge status: Home / Self Care | Payer: Self-pay | Source: Home / Self Care | Attending: Thoracic Surgery (Cardiothoracic Vascular Surgery)

## 2023-04-19 ENCOUNTER — Ambulatory Visit (HOSPITAL_COMMUNITY): Payer: Self-pay | Admitting: Certified Registered Nurse Anesthetist

## 2023-04-19 ENCOUNTER — Ambulatory Visit (HOSPITAL_COMMUNITY)
Admission: RE | Admit: 2023-04-19 | Discharge: 2023-04-19 | Disposition: A | Discharge status: Home / Self Care | Payer: Medicare Other | Attending: Thoracic Surgery (Cardiothoracic Vascular Surgery) | Admitting: Thoracic Surgery (Cardiothoracic Vascular Surgery)

## 2023-04-19 ENCOUNTER — Other Ambulatory Visit: Payer: Self-pay

## 2023-04-19 ENCOUNTER — Encounter (HOSPITAL_COMMUNITY): Payer: Self-pay | Admitting: Thoracic Surgery (Cardiothoracic Vascular Surgery)

## 2023-04-19 ENCOUNTER — Ambulatory Visit (HOSPITAL_BASED_OUTPATIENT_CLINIC_OR_DEPARTMENT_OTHER): Payer: Self-pay | Admitting: Certified Registered Nurse Anesthetist

## 2023-04-19 DIAGNOSIS — R1310 Dysphagia, unspecified: Secondary | ICD-10-CM

## 2023-04-19 DIAGNOSIS — K219 Gastro-esophageal reflux disease without esophagitis: Secondary | ICD-10-CM | POA: Diagnosis not present

## 2023-04-19 DIAGNOSIS — Z87891 Personal history of nicotine dependence: Secondary | ICD-10-CM | POA: Diagnosis not present

## 2023-04-19 DIAGNOSIS — Z01818 Encounter for other preprocedural examination: Secondary | ICD-10-CM

## 2023-04-19 DIAGNOSIS — K449 Diaphragmatic hernia without obstruction or gangrene: Secondary | ICD-10-CM

## 2023-04-19 DIAGNOSIS — I700 Atherosclerosis of aorta: Secondary | ICD-10-CM | POA: Diagnosis not present

## 2023-04-19 DIAGNOSIS — Z8719 Personal history of other diseases of the digestive system: Secondary | ICD-10-CM

## 2023-04-19 DIAGNOSIS — J45909 Unspecified asthma, uncomplicated: Secondary | ICD-10-CM | POA: Diagnosis not present

## 2023-04-19 DIAGNOSIS — E039 Hypothyroidism, unspecified: Secondary | ICD-10-CM | POA: Diagnosis not present

## 2023-04-19 HISTORY — PX: ESOPHAGOGASTRODUODENOSCOPY: SHX5428

## 2023-04-19 LAB — SURGICAL PCR SCREEN
MRSA, PCR: NEGATIVE
Staphylococcus aureus: NEGATIVE

## 2023-04-19 SURGERY — EGD (ESOPHAGOGASTRODUODENOSCOPY)
Anesthesia: General

## 2023-04-19 MED ORDER — ACETAMINOPHEN 10 MG/ML IV SOLN
INTRAVENOUS | Status: DC | PRN
  Administered 2023-04-19: 1000 mg via INTRAVENOUS

## 2023-04-19 MED ORDER — ONDANSETRON HCL 4 MG/2ML IJ SOLN
INTRAMUSCULAR | Status: DC | PRN
  Administered 2023-04-19: 4 mg via INTRAVENOUS

## 2023-04-19 MED ORDER — SUGAMMADEX SODIUM 200 MG/2ML IV SOLN
INTRAVENOUS | Status: DC | PRN
  Administered 2023-04-19: 200 mg via INTRAVENOUS
  Administered 2023-04-19: 100 mg via INTRAVENOUS

## 2023-04-19 MED ORDER — FENTANYL CITRATE (PF) 250 MCG/5ML IJ SOLN
INTRAMUSCULAR | Status: AC
  Filled 2023-04-19: qty 5

## 2023-04-19 MED ORDER — LACTATED RINGERS IV SOLN: INTRAVENOUS | Status: DC

## 2023-04-19 MED ORDER — ROCURONIUM BROMIDE 10 MG/ML (PF) SYRINGE
PREFILLED_SYRINGE | INTRAVENOUS | Status: DC | PRN
  Administered 2023-04-19: 20 mg via INTRAVENOUS
  Administered 2023-04-19: 40 mg via INTRAVENOUS

## 2023-04-19 MED ORDER — PROPOFOL 10 MG/ML IV BOLUS
INTRAVENOUS | Status: DC | PRN
  Administered 2023-04-19: 30 mg via INTRAVENOUS
  Administered 2023-04-19 (×2): 50 mg via INTRAVENOUS
  Administered 2023-04-19: 20 mg via INTRAVENOUS

## 2023-04-19 MED ORDER — 0.9 % SODIUM CHLORIDE (POUR BTL) OPTIME
TOPICAL | Status: DC | PRN
  Administered 2023-04-19: 1000 mL

## 2023-04-19 MED ORDER — ACETAMINOPHEN 10 MG/ML IV SOLN
INTRAVENOUS | Status: AC
  Filled 2023-04-19: qty 100

## 2023-04-19 MED ORDER — CHLORHEXIDINE GLUCONATE 0.12 % MT SOLN
15.0000 mL | Freq: Once | OROMUCOSAL | Status: AC
  Administered 2023-04-19: 15 mL via OROMUCOSAL
  Filled 2023-04-19: qty 15

## 2023-04-19 MED ORDER — ONDANSETRON HCL 4 MG/2ML IJ SOLN
INTRAMUSCULAR | Status: AC
  Filled 2023-04-19: qty 2

## 2023-04-19 MED ORDER — ORAL CARE MOUTH RINSE: 15.0000 mL | Freq: Once | OROMUCOSAL | Status: AC

## 2023-04-19 MED ORDER — AMISULPRIDE (ANTIEMETIC) 5 MG/2ML IV SOLN: 10.0000 mg | Freq: Once | INTRAVENOUS | Status: DC | PRN

## 2023-04-19 MED ORDER — PHENYLEPHRINE HCL-NACL 20-0.9 MG/250ML-% IV SOLN
INTRAVENOUS | Status: AC
  Filled 2023-04-19: qty 250

## 2023-04-19 MED ORDER — PROPOFOL 10 MG/ML IV BOLUS
INTRAVENOUS | Status: AC
  Filled 2023-04-19: qty 20

## 2023-04-19 MED ORDER — ESMOLOL HCL 100 MG/10ML IV SOLN
INTRAVENOUS | Status: DC | PRN
  Administered 2023-04-19 (×3): 20 mg via INTRAVENOUS

## 2023-04-19 MED ORDER — LIDOCAINE 2% (20 MG/ML) 5 ML SYRINGE
INTRAMUSCULAR | Status: DC | PRN
  Administered 2023-04-19: 60 mg via INTRAVENOUS

## 2023-04-19 MED ORDER — ESMOLOL HCL 100 MG/10ML IV SOLN
INTRAVENOUS | Status: AC
  Filled 2023-04-19: qty 10

## 2023-04-19 MED ORDER — FENTANYL CITRATE (PF) 100 MCG/2ML IJ SOLN: 25.0000 ug | INTRAMUSCULAR | Status: DC | PRN

## 2023-04-19 SURGICAL SUPPLY — 22 items
BUTTON OLYMPUS DEFENDO 5 PIECE (MISCELLANEOUS) ×1 IMPLANT
CANISTER SUCT 3000ML PPV (MISCELLANEOUS) ×1 IMPLANT
CNTNR URN SCR LID CUP LEK RST (MISCELLANEOUS) ×1 IMPLANT
CONT SPEC 4OZ STRL OR WHT (MISCELLANEOUS) ×1
GAUZE SPONGE 4X4 12PLY STRL (GAUZE/BANDAGES/DRESSINGS) ×1 IMPLANT
GLOVE BIO SURGEON STRL SZ7 (GLOVE) ×1 IMPLANT
GLOVE BIO SURGEON STRL SZ7.5 (GLOVE) ×1 IMPLANT
GOWN STRL REUS W/ TWL XL LVL3 (GOWN DISPOSABLE) ×1 IMPLANT
GOWN STRL REUS W/TWL XL LVL3 (GOWN DISPOSABLE) ×1
GUIDEWIRE JAGWIRE PULMNRY .035 (MISCELLANEOUS) IMPLANT
JAGWIRE PULMONARY .035 (MISCELLANEOUS) ×1
MARKER SKIN DUAL TIP RULER LAB (MISCELLANEOUS) ×1 IMPLANT
NS IRRIG 1000ML POUR BTL (IV SOLUTION) ×1 IMPLANT
OIL SILICONE PENTAX (PARTS (SERVICE/REPAIRS)) IMPLANT
PAD ARMBOARD 7.5X6 YLW CONV (MISCELLANEOUS) ×2 IMPLANT
SYR 20ML ECCENTRIC (SYRINGE) ×1 IMPLANT
TOWEL GREEN STERILE (TOWEL DISPOSABLE) ×1 IMPLANT
TOWEL GREEN STERILE FF (TOWEL DISPOSABLE) ×1 IMPLANT
TUBE CONNECTING 20X1/4 (TUBING) ×1 IMPLANT
TUBING ENDO SMARTCAP (MISCELLANEOUS) ×1 IMPLANT
UNDERPAD 30X36 HEAVY ABSORB (UNDERPADS AND DIAPERS) ×1 IMPLANT
WATER STERILE IRR 1000ML POUR (IV SOLUTION) ×1 IMPLANT

## 2023-04-19 NOTE — Transfer of Care (Signed)
Immediate Anesthesia Transfer of Care Note  Patient: Carol Lowery  Procedure(s) Performed: ESOPHAGOGASTRODUODENOSCOPY (EGD) WITH SAVORY DILATION  Patient Location: PACU  Anesthesia Type:General  Level of Consciousness: awake, alert , and oriented  Airway & Oxygen Therapy: Patient Spontanous Breathing  Post-op Assessment: Report given to RN, Post -op Vital signs reviewed and stable, Patient moving all extremities X 4, and Patient able to stick tongue midline  Post vital signs: Reviewed  Last Vitals:  Vitals Value Taken Time  BP 124/55   Temp 98.6   Pulse 72 04/19/23 1229  Resp 15 04/19/23 1229  SpO2 95 % 04/19/23 1229  Vitals shown include unfiled device data.  Last Pain:  Vitals:   04/19/23 0906  TempSrc:   PainSc: 0-No pain         Complications: No notable events documented.

## 2023-04-19 NOTE — Discharge Instructions (Signed)
Ok to resume regular diet and continue all medications

## 2023-04-19 NOTE — Plan of Care (Signed)
 CHL Tonsillectomy/Adenoidectomy, Postoperative PEDS care plan entered in error.

## 2023-04-19 NOTE — Interval H&P Note (Signed)
History and Physical Interval Note:  04/19/2023 11:02 AM  Carol Lowery  has presented today for surgery, with the diagnosis of PEH REPAIR.  The various methods of treatment have been discussed with the patient and family. After consideration of risks, benefits and other options for treatment, the patient has consented to  Procedure(s): ESOPHAGOGASTRODUODENOSCOPY (EGD) WITH SAVORY DILATION AND BOTOX INJECTION (N/A) as a surgical intervention.  The patient's history has been reviewed, patient examined, no change in status, stable for surgery.  I have reviewed the patient's chart and labs.  Questions were answered to the patient's satisfaction.     [name]

## 2023-04-19 NOTE — Anesthesia Procedure Notes (Signed)
Procedure Name: Intubation Date/Time: 04/19/2023 11:32 AM  Performed by: Cy Blamer, CRNAPre-anesthesia Checklist: Patient identified, Emergency Drugs available, Suction available and Patient being monitored Patient Re-evaluated:Patient Re-evaluated prior to induction Oxygen Delivery Method: Circle system utilized Preoxygenation: Pre-oxygenation with 100% oxygen Induction Type: IV induction Ventilation: Mask ventilation without difficulty Laryngoscope Size: Mac and 3 Grade View: Grade I Tube type: Oral Tube size: 7.0 mm Number of attempts: 2 Airway Equipment and Method: Stylet Placement Confirmation: ETT inserted through vocal cords under direct vision, positive ETCO2 and breath sounds checked- equal and bilateral Secured at: 21 cm Tube secured with: Tape Dental Injury: Teeth and Oropharynx as per pre-operative assessment  Comments: 1st attempt by SRNA w/MAC 3 blade (esophageal intubation). 2nd attempt by [facility] w/MAC 3 blade & BURP maneuver

## 2023-04-19 NOTE — Discharge Summary (Signed)
Physician Discharge Summary   Patient ID: Carol Lowery [id number] 82 y.o. 06-Apr-1941  Admit date: 04/19/2023  Discharge date and time: No discharge date for patient encounter.   Admitting Physician: [name], MD   Discharge Physician: [name]   Admission Diagnoses: Dysphagia  Discharge Diagnoses: same  Admission Condition: good  Discharged Condition: good  Indication for Admission: Outpatient procedure  Hospital Course: uncomplicated  Consults: None   Discharge Exam: Alert NAD Sinus EWOB Abd soft   Disposition: Discharge disposition: 01-Home or Self Care       Patient Instructions:  Allergies as of 04/19/2023       Reactions   Sulfa Antibiotics Anaphylaxis   Velosef [cephradine] Anaphylaxis   Garlic    vomiting   Other    Per patient very sensitive, Cortizone, steriods, etc. Including pain meds         Medication List     TAKE these medications    aspirin EC 81 MG tablet Take 81 mg by mouth daily.   Biotin 2500 MCG Chew Chew 5,000 mcg by mouth daily.   Calcium-Vitamin D-Vitamin K 650-12.5-40 MG-MCG-MCG Chew Chew 2 each by mouth daily.   levothyroxine 75 MCG tablet Commonly known as: SYNTHROID Take 75 mcg by mouth daily before breakfast.   Metamucil Fiber Chew Chew 3 each by mouth daily as needed (constipation).   Systane Balance 0.6 % Soln Generic drug: Propylene Glycol Place 1 drop into both eyes every morning.   Womens 50+ Multi Vitamin/Min Tabs Take 2 tablets by mouth daily.       Activity: activity as tolerated Diet: regular diet Wound Care: none needed  Follow-up with Dr. Cliffton Asters in 1 week.  SignedCorliss Skains 04/19/2023 12:35 PM

## 2023-04-19 NOTE — Anesthesia Preprocedure Evaluation (Signed)
Anesthesia Evaluation  Patient identified by MRN, date of birth, ID band Patient awake    Reviewed: Allergy & Precautions, NPO status , Patient's Chart, lab work & pertinent test results  Airway Mallampati: II  TM Distance: >3 FB Neck ROM: Full    Dental  (+) Dental Advisory Given   Pulmonary asthma , former smoker   breath sounds clear to auscultation       Cardiovascular  Rhythm:Regular Rate:Normal  PFO   Neuro/Psych  Neuromuscular disease    GI/Hepatic Neg liver ROS, hiatal hernia,GERD  ,,  Endo/Other  Hypothyroidism    Renal/GU negative Renal ROS     Musculoskeletal  (+) Arthritis ,    Abdominal   Peds  Hematology negative hematology ROS (+)   Anesthesia Other Findings   Reproductive/Obstetrics                             Anesthesia Physical Anesthesia Plan  ASA: 3  Anesthesia Plan: General   Post-op Pain Management: Ofirmev IV (intra-op)*   Induction: Intravenous  PONV Risk Score and Plan: 3 and Dexamethasone, Ondansetron and Treatment may vary due to age or medical condition  Airway Management Planned: Oral ETT  Additional Equipment: None  Intra-op Plan:   Post-operative Plan: Extubation in OR  Informed Consent: I have reviewed the patients History and Physical, chart, labs and discussed the procedure including the risks, benefits and alternatives for the proposed anesthesia with the patient or authorized representative who has indicated his/her understanding and acceptance.     Dental advisory given  Plan Discussed with: CRNA  Anesthesia Plan Comments:        Anesthesia Quick Evaluation

## 2023-04-19 NOTE — Op Note (Signed)
      [address]       [place] [address]             [phone]          04/19/23    Patient:  Carol Lowery Pre-Op Dx: s/p paraesophageal hernia repair   dysphagia Post-op Dx:  same Procedure: - Esophagogastroscopy - Savary dilation of the esophagus to 60 F/29mm     Surgeon and Role:      * Cornellius Kropp, Eliezer Lofts, MD - Primary   Anesthesia  general EBL:  minimal  Blood Administration: none Specimen:  none     Counts: correct     Indications: 82yo female s/p PEH repair in 2023, who has had ongoing dysphagia to solids and occasionally liquids.  She also complains of significant belching.   Findings: I was able to pass into the stomach without any obstruction.  Her pylorus was widely patent, with evidence of parastasis.  Post-dilation, there was no evidence any mucosal tears.    Operative Technique: After the risks, benefits and alternatives were thoroughly discussed, the patient was brought to the operative theatre.  Anesthesia was induced. The patient was prepped and draped in normal sterile fashion.  An appropriate surgical pause was performed, and pre-operative antibiotics were dosed accordingly.   The gastroscope was advanced through the oropharynx into the cervical esophagus under direct visualization.  The scope was passed into the stomach.  The scope was then pulled back, and the esophageal mucosa was visualized.  A mucosal was  normal appearing with some white plaques.  Next a Jag wire was passed through the gastroscope into the stomach with fluoroscopic guidance.  Savary dilators were used under fluoroscopic visualization.  We started with a 65F and dilated up to 5F without meeting any resistance.     The gastroscope was then placed, and the esophagus was visualized.  No bleeding was evident.      The patient tolerated the procedure without any immediate complications, and was transferred to the PACU in stable condition.   Lynnex Fulp Keane Scrape

## 2023-04-20 ENCOUNTER — Encounter (HOSPITAL_COMMUNITY): Payer: Self-pay | Admitting: Thoracic Surgery (Cardiothoracic Vascular Surgery)

## 2023-04-20 NOTE — Anesthesia Postprocedure Evaluation (Signed)
Anesthesia Post Note  Patient: Carol Lowery  Procedure(s) Performed: ESOPHAGOGASTRODUODENOSCOPY (EGD) WITH SAVORY DILATION     Patient location during evaluation: PACU Anesthesia Type: General Level of consciousness: awake and alert Pain management: pain level controlled Vital Signs Assessment: post-procedure vital signs reviewed and stable Respiratory status: spontaneous breathing, nonlabored ventilation, respiratory function stable and patient connected to nasal cannula oxygen Cardiovascular status: blood pressure returned to baseline and stable Postop Assessment: no apparent nausea or vomiting Anesthetic complications: no   No notable events documented.  Last Vitals:  Vitals:   04/19/23 1245 04/19/23 1300  BP: 132/61 128/63  Pulse: 69 67  Resp: 14 18  Temp:  36.7 C  SpO2: 94% 97%    Last Pain:  Vitals:   04/19/23 1300  TempSrc:   PainSc: 0-No pain                 Kennieth Rad

## 2023-04-30 ENCOUNTER — Other Ambulatory Visit: Payer: Self-pay | Admitting: Thoracic Surgery (Cardiothoracic Vascular Surgery)

## 2023-04-30 ENCOUNTER — Ambulatory Visit (INDEPENDENT_AMBULATORY_CARE_PROVIDER_SITE_OTHER): Payer: Medicare Other | Admitting: Thoracic Surgery (Cardiothoracic Vascular Surgery)

## 2023-04-30 VITALS — BP 138/77 | HR 76 | Resp 18 | Ht 61.0 in | Wt 124.0 lb

## 2023-04-30 DIAGNOSIS — Z9889 Other specified postprocedural states: Secondary | ICD-10-CM

## 2023-04-30 DIAGNOSIS — Z8719 Personal history of other diseases of the digestive system: Secondary | ICD-10-CM

## 2023-04-30 DIAGNOSIS — Z09 Encounter for follow-up examination after completed treatment for conditions other than malignant neoplasm: Secondary | ICD-10-CM

## 2023-04-30 MED ORDER — METOCLOPRAMIDE HCL 5 MG PO TABS: 5.0000 mg | ORAL_TABLET | Freq: Four times a day (QID) | ORAL | 0 refills | Status: DC

## 2023-04-30 NOTE — Progress Notes (Signed)
      [address]       [place] [address]             [phone]        [facility] Medical Record #[id number] Date of Birth: Dec 29, 1940  Referring: Shon Hale, * Primary Care: Shon Hale, MD Primary Cardiologist:Philip Nahser, MD  Reason for visit:   follow-up  History of Present Illness:     82yo female presents in follow-up after her esophageal dilation.  Her belching is not improved.  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  We were able to dilate up to 20mm without difficulty, and her pylorus is widely patent.  I have given her a prescription for reglan.  I will follow-up with her in 2 weeks, and have referred her to Dr. Lavon Paganini for further evaluation.   [name] 04/30/2023 12:55 PM

## 2023-05-12 ENCOUNTER — Ambulatory Visit (INDEPENDENT_AMBULATORY_CARE_PROVIDER_SITE_OTHER): Payer: Medicare Other | Admitting: Thoracic Surgery (Cardiothoracic Vascular Surgery)

## 2023-05-12 DIAGNOSIS — K449 Diaphragmatic hernia without obstruction or gangrene: Secondary | ICD-10-CM

## 2023-05-12 NOTE — Progress Notes (Signed)
     [address]       [place] [address]             [phone]       Patient: Home Provider: Office Consent for Telemedicine visit obtained.  Today's visit was completed via a real-time telehealth (see specific modality noted below). The patient/authorized person provided oral consent at the time of the visit to engage in a telemedicine encounter with the present provider at [facility]. The patient/authorized person was informed of the potential benefits, limitations, and risks of telemedicine. The patient/authorized person expressed understanding that the laws that protect confidentiality also apply to telemedicine. The patient/authorized person acknowledged understanding that telemedicine does not provide emergency services and that he or she would need to call 911 or proceed to the nearest hospital for help if such a need arose.   Total time spent in the clinical discussion 10 minutes.  Telehealth Modality: Phone visit (audio only)  I had a telephone visit with Mrs. O'Hearn.  She has decided not to take the reglan due to the side-effect profile.  She is still waiting to here back from Dr. Elana Alm office.    Neeraj Housand Keane Scrape

## 2023-06-10 ENCOUNTER — Encounter: Payer: Self-pay | Admitting: Nurse Practitioner

## 2023-06-10 ENCOUNTER — Ambulatory Visit: Payer: Medicare Other | Admitting: Nurse Practitioner

## 2023-06-10 VITALS — BP 138/70 | HR 80 | Ht 61.0 in | Wt 127.1 lb

## 2023-06-10 DIAGNOSIS — Z8719 Personal history of other diseases of the digestive system: Secondary | ICD-10-CM

## 2023-06-10 DIAGNOSIS — R1310 Dysphagia, unspecified: Secondary | ICD-10-CM | POA: Diagnosis not present

## 2023-06-10 DIAGNOSIS — R142 Eructation: Secondary | ICD-10-CM | POA: Diagnosis not present

## 2023-06-10 NOTE — Patient Instructions (Addendum)
_______________________________________________________  If your blood pressure at your visit was 140/90 or greater, please contact your primary care physician to follow up on this.  _______________________________________________________  If you are age 81 or older, your body mass index should be between 23-30. Your Body mass index is 24.02 kg/m. If this is out of the aforementioned range listed, please consider follow up with your Primary Care Provider.  If you are age 31 or younger, your body mass index should be between 19-25. Your Body mass index is 24.02 kg/m. If this is out of the aformentioned range listed, please consider follow up with your Primary Care Provider.   ________________________________________________________  The [facility] providers would like to encourage you to use [facility] to communicate with providers for non-urgent requests or questions.  Due to long hold times on the telephone, sending your provider a message by [facility] may be a faster and more efficient way to get a response.  Please allow 48 business hours for a response.  Please remember that this is for non-urgent requests.  _______________________________________________________  We will call you. If you have any question or concerns in the meantime please call us at [phone]. Have a good day  It was a pleasure to see you today!  Thank you for trusting me with your gastrointestinal care!

## 2023-06-10 NOTE — Progress Notes (Signed)
ASSESSMENT    Brief Narrative:  82 y.o.  female known to Dr. Rhea Belton. She has most recently been followed for a paraesophageal hernia which she had repaired in June 2023.  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  Recent barium swallow showing patulous esophagus with narrowing at site of fundoplication . She is s/p EGD with Savary dilation by Dr. Cliffton Asters 10/28 without improvement in symptoms  PFO, HLD, hypothyroidism. See PMH for any additional medical & surgical history   PLAN   --Will discuss with patient's primary GI, Dr. Rhea Belton to see if he has any suggestions.  Esophageal manometry ?  --She does not use straws, rarely has carbonated beverages  HPI   Chief complaint : Still having swallowing problems, worse with liquids.  Excessive belching  Patient was last seen May 2023. She was found to have a large HH on EGD. She underwent a paraesophageal hernia repair in June 2023 by Dr. Cliffton Asters.  There was some improvement in dysphagia but continued to have problems so barium swallow was done in October.   Barium swallow-esophagus is mildly to moderately patulous in appearance diffusely.  Prominent narrowing near the GE junction at the level of the fundoplication with prolonged pooling and retrograde /and fourth movement of contrast into the esophagus.  Contrast only intermittently passed through this area into the stomach but a barium tablet passed without delay.   She subsequent underwent  an EGD with Savary dilation of esophagus to 20 mm by Dr. Cliffton Asters on 04/19/23.  Not really doing a lot better.  Her main complaint is swallowing liquids and also excessive belching  Dr Cliffton Asters prescribed Metoclopramide but after reading about side effects didn't start it and doesn't want to .    GI History / Pertinent GI Studies   **All endoscopic studies may not be included here     May 2023 EGD - Tortuous esophagus. - Large hiatal hernia. - Normal stomach. - Normal examined duodenum. - No specimens collected.       Latest Ref Rng & Units 04/09/2023    1:45 PM 12/08/2021    2:31 PM  Hepatic Function  Total Protein 6.5 - 8.1 g/dL 6.6  6.4   Albumin 3.5 - 5.0 g/dL 3.9  3.9   AST 15 - 41 U/L 20  24   ALT 0 - 44 U/L 16  22   Alk Phosphatase 38 - 126 U/L 56  60   Total Bilirubin 0.3 - 1.2 mg/dL 0.6  0.8        Latest Ref Rng & Units 04/09/2023    1:45 PM 12/08/2021    2:31 PM  CBC  WBC 4.0 - 10.5 K/uL 7.8  7.4   Hemoglobin 12.0 - 15.0 g/dL 16.1  09.6   Hematocrit 36.0 - 46.0 % 39.2  40.0   Platelets 150 - 400 K/uL 300  284      Past Medical History:  Diagnosis Date   Anemia    Arthritis    generalized   Asthma    cough varient asthma;seasonal   Complication of anesthesia    slow to awaken   GERD (gastroesophageal reflux disease)    Head concussion 2018   causes decreased hearing   Headache    Heart murmur    as a child and while pregnant   Hiatal hernia    History of blood transfusion  1970   6 pints after childbirth   Hypothyroidism    Patent foramen ovale    TEE 08/14/15 [facility]): LVEF 55-60%, no RWMA, PFO with minimal right to left shunt, no ASD, mild MR, small fibrinous pericardial effsuion   Renal cyst    per patient benign   Thyroid disease     Past Surgical History:  Procedure Laterality Date   ABDOMINAL HYSTERECTOMY     APPENDECTOMY     appendectomy of left toe     COLONOSCOPY     DIAGNOSTIC LAPAROSCOPY     bilateral knees   ESOPHAGOGASTRODUODENOSCOPY N/A 12/10/2021   Procedure: ESOPHAGOGASTRODUODENOSCOPY (EGD);  Surgeon: [name], MD;  Location: [facility] OR;  Service: Thoracic;  Laterality: N/A;   ESOPHAGOGASTRODUODENOSCOPY N/A 04/19/2023   Procedure: ESOPHAGOGASTRODUODENOSCOPY (EGD) WITH SAVORY DILATION;  Surgeon: [name], MD;  Location: [facility] OR;  Service: Thoracic;  Laterality: N/A;   EYE  SURGERY     cataract removed bilaterally   JOINT REPLACEMENT     knee replacement left Left    RADICAL VAGINAL HYSTERECTOMY     right carpal tunnel release     TONSILLECTOMY     tonsils removed     UPPER GASTROINTESTINAL ENDOSCOPY     XI ROBOTIC ASSISTED PARAESOPHAGEAL HERNIA REPAIR N/A 12/10/2021   Procedure: XI ROBOTIC ASSISTED PARAESOPHAGEAL HERNIA REPAIR WITH FUNDOPLICATION;  Surgeon: [name], MD;  Location: [facility] OR;  Service: Thoracic;  Laterality: N/A;    Family History  Problem Relation Age of Onset   Brain cancer Mother        age 25   Raynaud syndrome Father    Lung cancer Father    Liver cancer Father    Bone cancer Father    Colon cancer Father        age 44   High blood pressure Brother     Current Medications, Allergies, Family History and Social History were reviewed in [facility] electronic medical record.     Current Outpatient Medications  Medication Sig Dispense Refill   metoCLOPramide (REGLAN) 5 MG tablet Take 1 tablet (5 mg total) by mouth 4 (four) times daily. (Patient not taking: Reported on 06/10/2023) 120 tablet 0   aspirin EC 81 MG tablet Take 81 mg by mouth daily.     Biotin 2500 MCG CHEW Chew 5,000 mcg by mouth daily.     Calcium-Vitamin D-Vitamin K 650-12.5-40 MG-MCG-MCG CHEW Chew 2 each by mouth daily.     levothyroxine (SYNTHROID) 75 MCG tablet Take 75 mcg by mouth daily before breakfast.     Metamucil Fiber CHEW Chew 3 each by mouth daily as needed (constipation).     Multiple Vitamins-Minerals (WOMENS 50+ MULTI VITAMIN/MIN) TABS Take 2 tablets by mouth daily.     Propylene Glycol (SYSTANE BALANCE) 0.6 % SOLN Place 1 drop into both eyes every morning. Doesn't necessarily use this brand of eye drops     No current facility-administered medications for this visit.    Review of Systems: No chest pain. No shortness of breath. No urinary complaints.    Physical Exam  Filed Weights   06/10/23 0853  Weight: 127 lb 2 oz (57.7  kg)   Wt Readings from Last 3 Encounters:  06/10/23 127 lb 2 oz (57.7 kg)  04/30/23 124 lb (56.2 kg)  04/19/23 136 lb 11 oz (62 kg)    BP 138/70   Pulse 80   Ht 5\' 1"  (1.549 m)   Wt 127 lb 2  oz (57.7 kg)   SpO2 100%   BMI 24.02 kg/m  Constitutional:  Pleasant, generally well appearing female in no acute distress. Psychiatric: Normal mood and affect. Behavior is normal. EENT: Pupils normal.  Conjunctivae are normal. No scleral icterus. Neck supple.  Cardiovascular: Normal rate, regular rhythm.  Pulmonary/chest: Effort normal and breath sounds normal. No wheezing, rales or rhonchi. Abdominal: Soft, nondistended, nontender. Bowel sounds active throughout. There are no masses palpable. No hepatomegaly. Neurological: Alert and oriented to person place and time.   Willette Cluster, NP  06/10/2023, 9:06 AM

## 2023-06-13 NOTE — Progress Notes (Signed)
Addendum: Reviewed and agree with assessment and management plan. Dr. Cliffton Asters did esophagram which I reviewed and an upper endoscopy which I reviewed.  The GE junction was patent and then he passed a 20 mm savory dilator which should be more than adequate to help swallowing.  With liquid dysphagia it does raise the question of motility.  I do not see any medications that would impair esophageal emptying. Dr. Cliffton Asters recommended low-dose metoclopramide which I think would be worth trying monitoring for side effects.  Long-term risks such as tardive dyskinesia should be minimal with intermittent low-dose use. No other recommendations that I can think of at this time Carol Lowery, Carie Caddy, MD

## 2023-06-21 ENCOUNTER — Telehealth: Payer: Self-pay | Admitting: *Deleted

## 2023-06-21 NOTE — Telephone Encounter (Signed)
-----   Message from Willette Cluster sent at 06/15/2023  1:38 PM EST ----- Carol Lowery,  Please let patient know I talked to Dr. Rhea Belton. He reviewed barium swallow and the surgeon's EGD report. The dilation should have been more than enough to give relief so concern if more for esophageal dysmotility at this point.   I understand her concerns about Reglan. However, Dr. Rhea Belton feels it is worth trying. Here is what he had to say.   "Dr. Cliffton Asters recommended low-dose metoclopramide which I think would be worth trying monitoring for side effects.  Long-term risks such as tardive dyskinesia should be minimal with intermittent low-dose use. No other recommendations that I can think of at this time"  Thanks

## 2023-06-21 NOTE — Telephone Encounter (Signed)
Called patient to inform of recommendations per Dr. Rhea Belton, patient states she has the prescription for Reglan, but has never taken the medication and has no plans to take this med. Patient states she will continue to go on like she has been and if anything else comes up she will call.

## 2023-07-02 ENCOUNTER — Other Ambulatory Visit: Payer: Self-pay | Admitting: Family Medicine

## 2023-07-02 DIAGNOSIS — Z1231 Encounter for screening mammogram for malignant neoplasm of breast: Secondary | ICD-10-CM

## 2023-07-29 ENCOUNTER — Other Ambulatory Visit (HOSPITAL_BASED_OUTPATIENT_CLINIC_OR_DEPARTMENT_OTHER): Payer: Self-pay

## 2023-07-29 MED ORDER — LEVOTHYROXINE SODIUM 75 MCG PO TABS
75.0000 ug | ORAL_TABLET | Freq: Every day | ORAL | 3 refills | Status: DC
  Filled 2023-07-29: qty 90, 90d supply, fill #0

## 2023-07-29 MED ORDER — MELOXICAM 15 MG PO TABS
15.0000 mg | ORAL_TABLET | Freq: Every day | ORAL | 0 refills | Status: DC
  Filled 2023-07-29: qty 5, 5d supply, fill #0

## 2023-08-03 ENCOUNTER — Other Ambulatory Visit: Payer: Self-pay | Admitting: Family Medicine

## 2023-08-03 DIAGNOSIS — E2839 Other primary ovarian failure: Secondary | ICD-10-CM

## 2023-08-09 ENCOUNTER — Ambulatory Visit: Payer: Medicare Other

## 2023-08-17 ENCOUNTER — Encounter: Payer: Self-pay | Admitting: Cardiovascular Disease

## 2023-08-17 ENCOUNTER — Ambulatory Visit: Payer: Medicare Other | Attending: Cardiovascular Disease | Admitting: Cardiovascular Disease

## 2023-08-17 VITALS — BP 134/72 | HR 82 | Ht 61.0 in | Wt 126.8 lb

## 2023-08-17 DIAGNOSIS — E782 Mixed hyperlipidemia: Secondary | ICD-10-CM | POA: Diagnosis present

## 2023-08-17 DIAGNOSIS — Q2112 Patent foramen ovale: Secondary | ICD-10-CM

## 2023-08-17 NOTE — Progress Notes (Signed)
 Cardiology Office Note:    Date:  08/17/2023   ID:  Carol Lowery, DOB 09-22-1940, MRN [id number]  PCP:  Shon Hale, MD  [facility] Cardiologist:  Kristeen Miss, MD  [facility] Electrophysiologist:  None   Referring MD: Shon Hale, *   Chief Complaint  Patient presents with   Hyperlipidemia    Feb. 4, 2022   Carol Lowery is a 83 y.o. female with a hx of PFO.  She followed with Dr. Achilles Dunk in [place], [place]  [phone])  She follwed with Dr.Gladstone yearly   No Cp or dyspnea.  Exercises regularly ,  10,000 steps a day , 5- miles on stationary bike a day  No CP , no dyspnea,  No syncope  Her PFO was diagnosed by echo ( after her ophthamologist found an abn. )  She also has a history of hyperlipidemia and is on Lipitor.  She has a history of hypothyroidism and takes Synthroid.  She takes aspirin 81 mg a day.   Feb. 21, 2023 Carol Lowery is seen today for follow up of her PFO Had COVID in Dec.  Has had dyspnea  Has been short of breath since Has a large hiatal hernia  that needs repair    Feb. 23, 2024 Carol Lowery is seen today for follow up of her PFO, Has had her hiatal hernia repaired Has recovered well   She does have some generalized aches and pains and is being tested for rheumatoid arthritis.  She is on atorvastatin.  Her lipid levels remain well-controlled.  She question whether or not she actually needs the atorvastatin.  I will give her the okay to stop her atorvastatin for several weeks to see if her aches and pains improve.  If it turns out that her aches and pains are due to rheumatoid arthritis then do not know that stopping the atorvastatin would necessarily help her.  Feb. 25, 2025 Carol Lowery is seen for follow up of her PFO  Has mild HLD  She was developing lots of muscle aches and pains on atorvastatin. Will get a coronary calcium score.  If she has significant coronary artery calcifications we will consider one of the  nonstatin medications to lower her cholesterol.  We could consider Zetia, bempedoic acid, or a PCSK9 inhibitor.     Past Medical History:  Diagnosis Date   Anemia    Arthritis    generalized   Asthma    cough varient asthma;seasonal   Complication of anesthesia    slow to awaken   GERD (gastroesophageal reflux disease)    Head concussion 2018   causes decreased hearing   Headache    Heart murmur    as a child and while pregnant   Hiatal hernia    History of blood transfusion 1970   6 pints after childbirth   Hypothyroidism    Patent foramen ovale    TEE 08/14/15 [facility]): LVEF 55-60%, no RWMA, PFO with minimal right to left shunt, no ASD, mild MR, small fibrinous pericardial effsuion   Renal cyst    per patient benign   Thyroid disease     Past Surgical History:  Procedure Laterality Date   ABDOMINAL HYSTERECTOMY     APPENDECTOMY     appendectomy of left toe     COLONOSCOPY     DIAGNOSTIC LAPAROSCOPY     bilateral knees   ESOPHAGOGASTRODUODENOSCOPY N/A 12/10/2021   Procedure: ESOPHAGOGASTRODUODENOSCOPY (EGD);  Surgeon: [name], MD;  Location: [facility]  OR;  Service: Thoracic;  Laterality: N/A;   ESOPHAGOGASTRODUODENOSCOPY N/A 04/19/2023   Procedure: ESOPHAGOGASTRODUODENOSCOPY (EGD) WITH SAVORY DILATION;  Surgeon: [name], MD;  Location: [facility] OR;  Service: Thoracic;  Laterality: N/A;   EYE SURGERY     cataract removed bilaterally   JOINT REPLACEMENT     knee replacement left Left    RADICAL VAGINAL HYSTERECTOMY     right carpal tunnel release     TONSILLECTOMY     tonsils removed     UPPER GASTROINTESTINAL ENDOSCOPY     XI ROBOTIC ASSISTED PARAESOPHAGEAL HERNIA REPAIR N/A 12/10/2021   Procedure: XI ROBOTIC ASSISTED PARAESOPHAGEAL HERNIA REPAIR WITH FUNDOPLICATION;  Surgeon: [name], MD;  Location: [facility] OR;  Service: Thoracic;  Laterality: N/A;    Current Medications: Current Meds  Medication Sig   aspirin EC 81 MG tablet  Take 81 mg by mouth daily.   Biotin 2500 MCG CHEW Chew 5,000 mcg by mouth daily.   Calcium-Vitamin D-Vitamin K 650-12.5-40 MG-MCG-MCG CHEW Chew 2 each by mouth daily.   ibuprofen (ADVIL) 200 MG tablet Take 200 mg by mouth.   levothyroxine (SYNTHROID) 75 MCG tablet Take 75 mcg by mouth daily before breakfast.   Metamucil Fiber CHEW Chew 3 each by mouth daily as needed (constipation).   Multiple Vitamins-Minerals (WOMENS 50+ MULTI VITAMIN/MIN) TABS Take 2 tablets by mouth daily.   polyethylene glycol powder (GLYCOLAX/MIRALAX) 17 GM/SCOOP powder Take by mouth daily.   Propylene Glycol (SYSTANE BALANCE) 0.6 % SOLN Place 1 drop into both eyes every morning. Doesn't necessarily use this brand of eye drops     Allergies:   Sulfa antibiotics, Velosef [cephradine], Garlic, and Other   Social History   Socioeconomic History   Marital status: Married    Spouse name: Not on file   Number of children: 3   Years of education: Not on file   Highest education level: Not on file  Occupational History   Occupation: retired  Tobacco Use   Smoking status: Former    Types: Cigarettes   Smokeless tobacco: Never   Tobacco comments:    03/30/23 Smoked ~ 2 years - prior to 1960  Vaping Use   Vaping status: Never Used  Substance and Sexual Activity   Alcohol use: Never   Drug use: Never   Sexual activity: Not on file  Other Topics Concern   Not on file  Social History Narrative   Not on file   Social Drivers of Health   Financial Resource Strain: Not on file  Food Insecurity: Not on file  Transportation Needs: Not on file  Physical Activity: Not on file  Stress: Not on file  Social Connections: Not on file     Family History: The patient's family history includes Bone cancer in her father; Brain cancer in her mother; Colon cancer in her father; High blood pressure in her brother; Liver cancer in her father; Lung cancer in her father; Raynaud syndrome in her father.  ROS:   Please see the  history of present illness.     All other systems reviewed and are negative.  EKGs/Labs/Other Studies Reviewed:    The following studies were reviewed today:   EKG:     EKG Interpretation Date/Time:  Tuesday August 17 2023 10:41:26 EST Ventricular Rate:  82 PR Interval:  124 QRS Duration:  80 QT Interval:  350 QTC Calculation: 408 R Axis:   31  Text Interpretation: Normal sinus rhythm Normal ECG When compared with ECG  of 08-Dec-2021 14:11, No significant change was found Confirmed by Kristeen Miss [phone]) on 08/17/2023 5:01:22 PM       Recent Labs: 04/09/2023: ALT 16; BUN 19; Creatinine, Ser 0.73; Hemoglobin 12.9; Platelets 300; Potassium 4.2; Sodium 138  Recent Lipid Panel No results found for: "CHOL", "TRIG", "HDL", "CHOLHDL", "VLDL", "LDLCALC", "LDLDIRECT"   Risk Assessment/Calculations:       Physical Exam:    Physical Exam: Blood pressure 134/72, pulse 82, height 5\' 1"  (1.549 m), weight 126 lb 12.8 oz (57.5 kg), SpO2 94%.      GEN:  Well nourished, well developed in no acute distress HEENT: Normal NECK: No JVD; No carotid bruits LYMPHATICS: No lymphadenopathy CARDIAC: RRR , no murmurs, rubs, gallops RESPIRATORY:  Clear to auscultation without rales, wheezing or rhonchi  ABDOMEN: Soft, non-tender, non-distended MUSCULOSKELETAL:  No edema; No deformity  SKIN: Warm and dry NEUROLOGIC:  Alert and oriented x 3   ASSESSMENT:    1. Mixed hyperlipidemia      PLAN:      Patent foramen ovale:   stable .  No neuro symptoms .   2.  Episodes of sinus tach.  Stable .     3.  Hyperlipidemia :   she is intolerant to statins.   Will get a coronary calcium score . If her CAC score is elevated, will consider alternatives to statins         Medication Adjustments/Labs and Tests Ordered: Current medicines are reviewed at length with the patient today.  Concerns regarding medicines are outlined above.  Orders Placed This Encounter  Procedures   CT CARDIAC  SCORING (SELF PAY ONLY)   EKG 12-Lead   No orders of the defined types were placed in this encounter.   Patient Instructions  Testing/Procedures: Coronary Calcium Score CT Your physician has requested that you have cardiac CT. Cardiac computed tomography (CT) is a painless test that uses an x-ray machine to take clear, detailed pictures of your heart. For further information please visit [email or web address] Please follow instruction sheet as given.  Follow-Up: At [facility], you and your health needs are our priority.  As part of our continuing mission to provide you with exceptional heart care, we have created designated Provider Care Teams.  These Care Teams include your primary Cardiologist (physician) and Advanced Practice Providers (APPs -  Physician Assistants and Nurse Practitioners) who all work together to provide you with the care you need, when you need it.  Your next appointment:   1 year(s)  Provider:   Riley Lam, MD   1st Floor: - Lobby - Registration  - Pharmacy  - Lab - Cafe  2nd Floor: - PV Lab - Diagnostic Testing (echo, CT, nuclear med)  3rd Floor: - Vacant  4th Floor: - TCTS (cardiothoracic surgery) - AFib Clinic - Structural Heart Clinic - Vascular Surgery  - Vascular Ultrasound  5th Floor: - HeartCare Cardiology (general and EP) - Clinical Pharmacy for coumadin, hypertension, lipid, weight-loss medications, and med management appointments    Valet parking services will be available as well.     Signed, Kristeen Miss, MD  08/17/2023 5:01 PM    [facility]

## 2023-08-17 NOTE — Patient Instructions (Signed)
 Testing/Procedures: Coronary Calcium Score CT Your physician has requested that you have cardiac CT. Cardiac computed tomography (CT) is a painless test that uses an x-ray machine to take clear, detailed pictures of your heart. For further information please visit [email or web address] Please follow instruction sheet as given.  Follow-Up: At [facility], you and your health needs are our priority.  As part of our continuing mission to provide you with exceptional heart care, we have created designated Provider Care Teams.  These Care Teams include your primary Cardiologist (physician) and Advanced Practice Providers (APPs -  Physician Assistants and Nurse Practitioners) who all work together to provide you with the care you need, when you need it.  Your next appointment:   1 year(s)  Provider:   Riley Lam, MD   1st Floor: - Lobby - Registration  - Pharmacy  - Lab - Cafe  2nd Floor: - PV Lab - Diagnostic Testing (echo, CT, nuclear med)  3rd Floor: - Vacant  4th Floor: - TCTS (cardiothoracic surgery) - AFib Clinic - Structural Heart Clinic - Vascular Surgery  - Vascular Ultrasound  5th Floor: - HeartCare Cardiology (general and EP) - Clinical Pharmacy for coumadin, hypertension, lipid, weight-loss medications, and med management appointments    Valet parking services will be available as well.

## 2023-09-06 ENCOUNTER — Ambulatory Visit: Payer: Medicare Other

## 2023-09-21 ENCOUNTER — Ambulatory Visit
Admission: RE | Admit: 2023-09-21 | Discharge: 2023-09-21 | Disposition: A | Discharge status: Home / Self Care | Payer: Medicare Other | Source: Ambulatory Visit | Attending: Family Medicine | Admitting: Family Medicine

## 2023-09-21 DIAGNOSIS — Z1231 Encounter for screening mammogram for malignant neoplasm of breast: Secondary | ICD-10-CM

## 2023-09-22 ENCOUNTER — Ambulatory Visit (HOSPITAL_COMMUNITY)
Admission: RE | Admit: 2023-09-22 | Discharge: 2023-09-22 | Disposition: A | Discharge status: Home / Self Care | Payer: Medicare Other | Source: Ambulatory Visit | Attending: Cardiovascular Disease | Admitting: Cardiovascular Disease

## 2023-09-22 DIAGNOSIS — E782 Mixed hyperlipidemia: Secondary | ICD-10-CM | POA: Insufficient documentation

## 2023-09-23 ENCOUNTER — Encounter: Payer: Self-pay | Admitting: Cardiovascular Disease

## 2023-09-23 DIAGNOSIS — Z5181 Encounter for therapeutic drug level monitoring: Secondary | ICD-10-CM

## 2023-09-23 DIAGNOSIS — E782 Mixed hyperlipidemia: Secondary | ICD-10-CM

## 2023-09-24 MED ORDER — EZETIMIBE 10 MG PO TABS: 10.0000 mg | ORAL_TABLET | Freq: Every day | ORAL | 3 refills | Status: DC

## 2023-10-28 ENCOUNTER — Ambulatory Visit: Admitting: Podiatry

## 2023-11-12 ENCOUNTER — Other Ambulatory Visit (HOSPITAL_COMMUNITY): Payer: Self-pay

## 2023-11-12 ENCOUNTER — Encounter (HOSPITAL_COMMUNITY): Payer: Self-pay

## 2023-11-13 LAB — LIPID PANEL
Chol/HDL Ratio: 3.2 ratio (ref 0.0–4.4)
Cholesterol, Total: 208 mg/dL — ABNORMAL HIGH (ref 100–199)
HDL: 65 mg/dL (ref >39)
LDL Chol Calc (NIH): 120 mg/dL — ABNORMAL HIGH (ref 0–99)
Triglycerides: 132 mg/dL (ref 0–149)
VLDL Cholesterol Cal: 23 mg/dL (ref 5–40)

## 2023-11-13 LAB — ALT: ALT: 16 IU/L (ref 0–32)

## 2023-11-16 ENCOUNTER — Ambulatory Visit: Payer: Self-pay | Admitting: Internal Medicine

## 2023-11-16 DIAGNOSIS — Z79899 Other long term (current) drug therapy: Secondary | ICD-10-CM

## 2023-11-16 DIAGNOSIS — E782 Mixed hyperlipidemia: Secondary | ICD-10-CM

## 2023-11-17 MED ORDER — ROSUVASTATIN CALCIUM 5 MG PO TABS: 5.0000 mg | ORAL_TABLET | Freq: Every day | ORAL | 2 refills | Status: AC

## 2023-11-26 ENCOUNTER — Ambulatory Visit (INDEPENDENT_AMBULATORY_CARE_PROVIDER_SITE_OTHER)

## 2023-11-26 ENCOUNTER — Ambulatory Visit (INDEPENDENT_AMBULATORY_CARE_PROVIDER_SITE_OTHER): Admitting: Podiatry

## 2023-11-26 DIAGNOSIS — M76822 Posterior tibial tendinitis, left leg: Secondary | ICD-10-CM

## 2023-11-26 DIAGNOSIS — Q666 Other congenital valgus deformities of feet: Secondary | ICD-10-CM

## 2023-11-26 DIAGNOSIS — M25572 Pain in left ankle and joints of left foot: Secondary | ICD-10-CM

## 2023-11-26 DIAGNOSIS — M7752 Other enthesopathy of left foot: Secondary | ICD-10-CM | POA: Diagnosis not present

## 2023-11-26 NOTE — Patient Instructions (Signed)
Posterior Tibial Tendon Tear Rehab Ask your health care provider which exercises are safe for you. Do exercises exactly as told by your provider and adjust them as told. It is normal to feel mild stretching, pulling, tightness, or discomfort as you do these exercises. Stop right away if you feel sudden pain or your pain gets worse. Do not begin these exercises until told by your provider. Stretching and range-of-motion exercises These exercises warm up your muscles and joints and improve the movement and flexibility of your ankle. These exercises also help to relieve pain, numbness, and tingling. Gastrocnemius stretch  Sit on the floor with your left / right leg extended. Loop a belt or towel around ball of your left / right foot. The ball of your foot is on the walking surface, right under your toes. Keep your left / right ankle and foot relaxed and keep your knee straight while you use the belt or towel to pull your foot and ankle toward you. You should feel a gentle stretch behind your calf or knee (gastrocnemius). Hold this position for __________ seconds. Repeat __________ times. Complete this exercise __________ times a day. Active ankle dorsiflexion and plantar flexion  Sit with your left / right knee straight or bent. Do not rest your foot on anything. Flex your left / right ankle to tilt the top of your foot toward your shin (dorsiflexion). Hold this position for __________ seconds. Point your toes downward to tilt the top of your foot away from your shin (plantar flexion). Hold this position for __________ seconds. Repeat __________ times with your knee straight and __________ times with your knee bent. Complete this exercise __________ times a day. Passive ankle plantar flexion  Sit with your left / right leg crossed over your opposite knee. With your left / right hand, pull the front of your foot and toes toward you (plantar flexion). You should feel a gentle stretch on the top of  your foot and ankle. Hold this position for __________ seconds. Repeat __________ times. Complete this exercise __________ times a day. Passive ankle eversion  Sit with your left / right ankle crossed over your opposite knee. With your left / right hand, hold your foot so that your thumb is on the top of your foot and your fingers are on the bottom of your foot. Gently push and twist your ankle downward (eversion). You should feel a gentle stretch on the inside of your ankle. Hold this stretch for __________ seconds. Repeat __________ times. Complete this exercise __________ times a day. Passive ankle inversion  Sit with your left / right ankle crossed over your opposite knee. With your left / right hand, hold your foot so that your thumb is on the bottom of your foot and your fingers are across the top of your foot. Gently pull and twist your ankle upward (inversion). You should feel a gentle stretch on the outside of your ankle. Hold the stretch for __________ seconds. Repeat __________ times. Complete this exercise __________ times a day. Ankle alphabet  Sit with your left / right leg supported at the lower leg. Do not rest your foot on anything. Make sure your foot has room to move freely. Think of your left / right foot as a paintbrush, and move your foot to trace each letter of the alphabet in the air. Keep your hip and knee still while you trace. Trace every letter of the alphabet. Repeat __________ times. Complete this exercise __________ times a day. Strengthening exercises  These exercises build strength and endurance in your lower leg. Endurance is the ability to use your muscles for a long time, even after they get tired. Dorsiflexion  Secure a rubber exercise band or tube to an object that will not move if it is pulled on, such as a table leg. Secure the other end of the band around your left / right foot. Sit on the floor, facing the object with your left / right leg  extended. The band or tube should be slightly tense when your foot is relaxed. Slowly flex your left / right ankle and toes to bring your foot toward you (dorsiflexion). Hold this position for __________ seconds. Let the band or tube slowly pull your foot back to the starting position. Repeat __________ times. Complete this exercise __________ times a day. Plantar flexion while seated  Sit on the floor with your left / right leg extended. Loop a rubber exercise band or tube around the ball of your left / right foot. The ball of your foot is on the walking surface, right under your toes. The band or tube should be slightly tense when your foot is relaxed. Slowly point your toes downward, pushing them away from you (plantar flexion). Hold this position for __________ seconds. Let the band or tube slowly pull your foot back to the starting position. Repeat __________ times. Complete this exercise __________ times a day. Towel curls  Sit in a chair on a non-carpeted surface, and put your feet on the floor. Place a towel in front of your feet. If told by your provider, add __________ to the end of the towel. Keeping your heel on the floor, put your left / right foot on the towel. Pull the towel toward you by grabbing the towel with your toes and curling them under. Keep your heel on the floor. Repeat __________ times. Complete this exercise __________ times a day. This information is not intended to replace advice given to you by your health care provider. Make sure you discuss any questions you have with your health care provider. Document Revised: 06/25/2022 Document Reviewed: 06/25/2022 Elsevier Patient Education  2024 [employer].

## 2023-11-30 NOTE — Progress Notes (Signed)
  Subjective:  Patient ID: Carol Lowery, female    DOB: 1941/06/20,  MRN: [id number]  Chief Complaint  Patient presents with   Foot Pain    RM#13 Foot pain swelling hot to the touch treated at walk in clinic localized in the ankle region.    Discussed the use of AI scribe software for clinical note transcription with the patient, who gave verbal consent to proceed.  History of Present Illness Carol Lowery is an 83 year old female with posterior tibial tendon issues who presents with severe left ankle pain and swelling.  She has experienced severe pain and swelling in her left ankle for a month, with the pain described as very painful and hot to the touch. Swelling has reduced with rest, icing, and elevation. She has difficulty putting on socks and shoes due to pain and experiences a pulling sensation when walking.  X-rays from a walk-in clinic showed no fracture. She uses Advil, rest, icing, and elevation but continues to experience significant pain and difficulty walking. She walks between eight to twenty thousand steps a day. She wears sneakers with orthotics and avoids sandals.  She cannot take anti-inflammatory medications like ibuprofen or steroids due to adverse effects such as low blood pressure, shakiness, and insomnia. She uses Tylenol  and topical treatments like Tiger Balm and Aspercreme for pain relief, though they are minimally effective.     Objective:    Physical Exam General: AAO x3, NAD  Dermatological: Skin is warm, dry and supple bilateral.  There are no open sores, no preulcerative lesions, no rash or signs of infection present.  Vascular: Dorsalis Pedis artery and Posterior Tibial artery pedal pulses are 2/4 bilateral with immedate capillary fill time.  There is no pain with calf compression, swelling, warmth, erythema.   Neruologic: Grossly intact via light touch bilateral.   Musculoskeletal: Decreased medial arch upon weightbearing.  There is tenderness  palpation of the course of the posterior tibial tendon.  There is localized edema on the flexor, posterior tibial tendons.  Clinically the tendon appears to be intact and not a complete rupture.  There is no area pinpoint tenderness.    No images are attached to the encounter.    Results RADIOLOGY Ankle X-ray (10/28/2023): No evidence of acute fracture.  Arthritic changes present of the first MTPJ.  Calcaneal spurring is present.  Decreased calcaneal inclination angle.   Assessment:   1. Posterior tibial tendon dysfunction (PTTD) of left lower extremity   2. Pes planovalgus      Plan:  Patient was evaluated and treated and all questions answered.  Assessment and Plan Assessment & Plan Posterior tibial tendonitis with likely partial tear - Apply topical analgesics: Tiger Balm, Biofreeze, or Aspercreme with lidocaine  as she is not able to use anti-inflammatories or steroids. - We discussed a cam boot.  She will try an ankle brace.  Tri-Lock ankle brace was dispensed help support the ankle to help facilitate soft tissue healing. - Continue icing and limit activity. - Provide exercise worksheet for later use to improve - Encourage sneakers with orthotics for arch support. - MRI ordered to further evaluate the posterior tibial tendon.  Flat foot (pes planus) Flat foot structure stresses posterior tibial tendon. Arch support is crucial. - Advise shoes with good arch support. - Avoid flat sandals until improvement.  Return in about 4 weeks (around 12/24/2023) for ankle pain.   Charity Conch DPM

## 2023-12-09 ENCOUNTER — Ambulatory Visit
Admission: RE | Admit: 2023-12-09 | Discharge: 2023-12-09 | Disposition: A | Discharge status: Home / Self Care | Source: Ambulatory Visit | Attending: Podiatry

## 2023-12-09 DIAGNOSIS — M76822 Posterior tibial tendinitis, left leg: Secondary | ICD-10-CM

## 2023-12-14 ENCOUNTER — Ambulatory Visit: Payer: Self-pay | Admitting: Podiatry

## 2023-12-21 ENCOUNTER — Ambulatory Visit: Admitting: Podiatry

## 2023-12-21 DIAGNOSIS — M7750 Other enthesopathy of unspecified foot: Secondary | ICD-10-CM | POA: Diagnosis not present

## 2023-12-21 DIAGNOSIS — M7752 Other enthesopathy of left foot: Secondary | ICD-10-CM

## 2023-12-21 DIAGNOSIS — R937 Abnormal findings on diagnostic imaging of other parts of musculoskeletal system: Secondary | ICD-10-CM | POA: Diagnosis not present

## 2023-12-21 NOTE — Progress Notes (Unsigned)
  Subjective:  Patient ID: Carol Lowery, female    DOB: 02-09-1941,  MRN: [id number]  Chief Complaint  Patient presents with   Ankle Pain    RM#13 Follow up on left ankle pain review MRI results and discuss future plan.Patient states still experiencing ankle pain depends on what activities she does during the day but no improvement.   History of Present Illness Carol Lowery is an 83 year old female with posterior tibial tendon issues who presents with severe left ankle pain and swelling.  She presents today to further discuss MRI.  She states that she did try to wear the Tri-Lock ankle brace however puts pressure onto the medial malleolus that she points to this to where she gets most of her pain.  She gets that sharp pain in the area.  No recent injuries or falls otherwise.  She does states that she has had a remote injury to her left ankle and since then she does get issues as always the left ankle    Objective:    Physical Exam General: AAO x3, NAD  Dermatological: Skin is warm, dry and supple bilateral.  There are no open sores, no preulcerative lesions, no rash or signs of infection present.  Vascular: Dorsalis Pedis artery and Posterior Tibial artery pedal pulses are 2/4 bilateral with immedate capillary fill time.  There is no pain with calf compression, swelling, warmth, erythema.   Neruologic: Grossly intact via light touch bilateral.   Musculoskeletal: Decreased medial arch upon weightbearing.  There is tenderness palpation of the course of the posterior tibial tendon.  Tenderness also noted directly on the medial malleolus but is trace edema.  There is no erythema or warmth flexor, extensor tendons appear to be intact.      Assessment:   1. Posterior tibial tendon dysfunction (PTTD) of left lower extremity   2. Pes planovalgus      Plan:  Patient was evaluated and treated and all questions answered.  Assessment and Plan Assessment & Plan Posterior tibial  tendonitis, marrow edema medial malleolus -I reviewed the MRI with the patient.  Her symptoms do correlate to where there is marrow edema in the medial malleolus margin and subcutaneous edema at the ankle.  She has tenderness likely along the medial malleolus.  Outside to be protected weightbearing.  She is not able to do a Tri-Lock ankle brace.  Dispensed a cam boot to help off load.  Discussed icing and topical medications that she is still on oral agents.  Also referral to physical therapy was placed today.   Return in about 4 weeks (around 01/18/2024) for ankle pain, possible x-ray.  Donnice JONELLE Fees DPM

## 2023-12-27 NOTE — Therapy (Signed)
 OUTPATIENT PHYSICAL THERAPY LOWER EXTREMITY EVALUATION   Patient Name: Carol Lowery MRN: [id number] DOB:17-Oct-1940, 83 y.o., female Today's Date: 12/28/2023  END OF SESSION:  PT End of Session - 12/28/23 1501     Visit Number 1    Date for PT Re-Evaluation 02/22/24    Authorization Type Medicare/[employer]na Supplement    Progress Note Due on Visit 10    PT Start Time 1400    PT Stop Time 1442    PT Time Calculation (min) 42 min    Activity Tolerance Patient tolerated treatment well    Behavior During Therapy WFL for tasks assessed/performed          Past Medical History:  Diagnosis Date   Anemia    Arthritis    generalized   Asthma    cough varient asthma;seasonal   Complication of anesthesia    slow to awaken   GERD (gastroesophageal reflux disease)    Head concussion 2018   causes decreased hearing   Headache    Heart murmur    as a child and while pregnant   Hiatal hernia    History of blood transfusion 1970   6 pints after childbirth   Hypothyroidism    Patent foramen ovale    TEE 08/14/15 [facility]): LVEF 55-60%, no RWMA, PFO with minimal right to left shunt, no ASD, mild MR, small fibrinous pericardial effsuion   Renal cyst    per patient benign   Thyroid disease    Past Surgical History:  Procedure Laterality Date   ABDOMINAL HYSTERECTOMY     APPENDECTOMY     appendectomy of left toe     COLONOSCOPY     DIAGNOSTIC LAPAROSCOPY     bilateral knees   ESOPHAGOGASTRODUODENOSCOPY N/A 12/10/2021   Procedure: ESOPHAGOGASTRODUODENOSCOPY (EGD);  Surgeon: Shyrl Linnie KIDD, MD;  Location: [facility] OR;  Service: Thoracic;  Laterality: N/A;   ESOPHAGOGASTRODUODENOSCOPY N/A 04/19/2023   Procedure: ESOPHAGOGASTRODUODENOSCOPY (EGD) WITH SAVORY DILATION;  Surgeon: Shyrl Linnie KIDD, MD;  Location: [facility] OR;  Service: Thoracic;  Laterality: N/A;   EYE SURGERY     cataract removed bilaterally   JOINT REPLACEMENT     knee replacement left Left    RADICAL VAGINAL  HYSTERECTOMY     right carpal tunnel release     TONSILLECTOMY     tonsils removed     UPPER GASTROINTESTINAL ENDOSCOPY     XI ROBOTIC ASSISTED PARAESOPHAGEAL HERNIA REPAIR N/A 12/10/2021   Procedure: XI ROBOTIC ASSISTED PARAESOPHAGEAL HERNIA REPAIR WITH FUNDOPLICATION;  Surgeon: Shyrl Linnie KIDD, MD;  Location: [facility] OR;  Service: Thoracic;  Laterality: N/A;   Patient Active Problem List   Diagnosis Date Noted   S/P repair of paraesophageal hernia 12/10/2021   Acquired hypothyroidism 09/12/2021   Congenital heart disease 09/12/2021   Decreased estrogen level 09/12/2021   Hiatal hernia 09/12/2021   Iron deficiency anemia 09/12/2021   Pure hypercholesterolemia 09/12/2021   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   Patent foramen ovale 07/26/2020   PTTD (posterior tibial tendon dysfunction) 05/22/2019    PCP:   Chrystal Lamarr RAMAN, MD    REFERRING PROVIDER: [name], DPM  REFERRING DIAG: M77.50 (ICD-10-CM) - Tendonitis of ankle  THERAPY DIAG:  Pain in left ankle and joints of left foot  Muscle weakness (generalized)  Other abnormalities of gait and mobility  Localized edema  Rationale for Evaluation and Treatment: Rehabilitation  ONSET DATE: Chronic flare up April 2025  SUBJECTIVE:   SUBJECTIVE STATEMENT: Patient presents with left ankle  pain that began April 2025, but it is a chronic problem. 40 [place]. Nothing was broken but it bruised really bad. She just has occasional tendonitis flare ups.  Dr. Gershon gave her a boot to wear a week ago but it really flared up her back pain so she went back to the supportive ankle brace. She likes this better. She elevates her ankle when she sits and ices it every two hours for 20 mins. This does help decrease pain. She is still able to keep her steps up (her goal is 10,000 per day), but with increased activity the pain is worse.  PERTINENT HISTORY: OA; Hypothyroidism; Hx Left knee  replacement;  PAIN:  Are you having pain? Yes: NPRS scale: 5(currently) 8(worst) / 10 Pain location: Lt Medial malleoli and shoots up towards calf Pain description: sharp, shooting Aggravating factors: Touching the area; running multiple errands, going up a curb Relieving factors: ice; elevation Advil when the pain is really bad   PRECAUTIONS: None  RED FLAGS: None   WEIGHT BEARING RESTRICTIONS: No  FALLS:  Has patient fallen in last 6 months? No  LIVING ENVIRONMENT: Lives with: lives with their spouse Lives in: House/apartment Stairs: yes stairs but her room is on the main level; stairs to come into the house Has following equipment at home: Grab bars  OCCUPATION: Retired; worked for a church  PLOF: Independent, Independent with basic ADLs, Independent with household mobility without device, Independent with community mobility without device, Independent with gait, Independent with transfers, and Leisure: Travel; hang out with family  PATIENT GOALS: To improve ROM, strength & function   NEXT MD VISIT: 3 weeks  OBJECTIVE:  Note: Objective measures were completed at Evaluation unless otherwise noted.  DIAGNOSTIC FINDINGS:  Ankle MRI 6/19 Impression: 1. Infiltration of the sinus Tarsi and mechanical stress marrow edema around the subtalar joint/sinus Tarsi 2. Proximal plantar aponeurosis degeneration and small plantar calcaneal spur 3. Thickened intermediate signal anterior talofibular and calcaneofibular ligaments 4. Flexor and peroneus longus/brevis tenosynovitis and lesser extensor tenosynovitis 5. Mild edema of the distal calf muscles possibly use or nerve related 6. Marrow edema at the medial malleolar margin and subcutaneous edema at the inner ankle 7. Hindfoot valgus deformity and suggestion of mild abutment between the lateral malleolus and posterior outer calcaneus  PATIENT SURVEYS:  LEFS: 54/80 67.5%   COGNITION: Overall cognitive status: Within  functional limits for tasks assessed     SENSATION: WFL  EDEMA:  Increased edema Lt medial malleoli    POSTURE: rounded shoulders  PALPATION: Tenderness with palpation around medial malleoli Tenderness with palpation tibialis anterior & calf   LOWER EXTREMITY MNF:EMNF WFL; more discomfort at end range movements    LOWER EXTREMITY MMT: MMT limited due to pain with resistance. Inversion more painful than eversion. Grossly 4-/5   FUNCTIONAL TESTS:  5 times sit to stand: 10.79 sec no UE support (no brace) Timed up and go (TUG): 10.30 sec (no brace; pain)  GAIT: Comments: Antalgic gait; decreased stance on Lt LE  TREATMENT DATE:  12/28/2023 Initial Evaluation & HEP created  Education on getting a shoe lift on right shoe  PATIENT EDUCATION:  Education details: PT eval findings, anticipated POC, progress with PT, and initial HEP Person educated: Patient Education method: Explanation, Demonstration, and Handouts Education comprehension: verbalized understanding, returned demonstration, and needs further education  HOME EXERCISE PROGRAM: Access Code: [email or web address] URL: [email or web address] Date: 12/28/2023 Prepared by: Kristeen Sar  Exercises - Supine Ankle Pumps in Elevation on Pillows  - 1-2 x daily - 7 x weekly - 2 sets - 10 reps - Seated Ankle Alphabet  - 1-2 x daily - 7 x weekly - 1 sets - Long Sitting Calf Stretch with Strap  - 1-2 x daily - 7 x weekly - 2 sets - 20-30s hold  ASSESSMENT:  CLINICAL IMPRESSION: Patient is a 83 y.o. female who was seen today for physical therapy evaluation and treatment for left ankle pain. Maxcine presents with chronic ankle pain that flared up April 2025. She injured her ankle 40 years ago, and she deals with occasional flare ups of tendonitis. She presents with increased edema around her medial  malleoli and tenderness with palpation around the near. Patient has a boot, but that really flared up her back pain. Educated patient on the use of a shoe lift on her right foot to prevent an imbalance. Patient prefers wearing the ankle support brace. Based on evaluation noted muscle weakness, poor gait mechanics, and increased edema. Patient is active and tried to get 10,000 steps per day. She is motivated and want to better manage flare ups. Patient will benefit from skilled PT to address the below impairments and improve overall function.   OBJECTIVE IMPAIRMENTS: Abnormal gait, decreased activity tolerance, decreased balance, difficulty walking, decreased ROM, decreased strength, hypomobility, increased edema, impaired perceived functional ability, increased muscle spasms, impaired flexibility, and pain.   ACTIVITY LIMITATIONS: standing, stairs, transfers, and locomotion level  PARTICIPATION LIMITATIONS: meal prep, cleaning, interpersonal relationship, shopping, community activity, and yard work  PERSONAL FACTORS: Age and Time since onset of injury/illness/exacerbation are also affecting patient's functional outcome.   REHAB POTENTIAL: Good  CLINICAL DECISION MAKING: Evolving/moderate complexity  EVALUATION COMPLEXITY: Moderate   GOALS: Goals reviewed with patient? Yes  SHORT TERM GOALS: Target date: 01/25/2024  Patient will be independent with initial HEP. Baseline:  Goal status: INITIAL  2.  Patient will report > or = to 30%  improvement in left ankle pain since starting PT. Baseline:  Goal status: INITIAL   3.  Patient will be able to ascend/ descend a curb with LRAD for improved community mobility. Baseline:  Goal status: INITIAL   LONG TERM GOALS: Target date: 02/22/2024  Patient will demonstrate independence in advanced HEP. Baseline:  Goal status: INITIAL  2.  Patient will report > or = to 60% improvement in left ankle pain since starting PT. Baseline:  Goal status:  INITIAL  3.  Patient will verbalize and demonstrate self-care strategies to manage pain including tissue mobility practices and change of position. Baseline:  Goal status: INITIAL  4.  Patient will be able to walk > or = to 10,000 steps per day with < or = to 2/10 left ankle discomfort. Baseline:  Goal status: INITIAL  5.  Patient will score > or = to 63/80 on LEFS due to improved ability to perform functional activities.  Baseline: 54/80 Goal status: INITIAL   PLAN:  PT FREQUENCY: 2x/week  PT DURATION: 8 weeks  PLANNED INTERVENTIONS: 97164- PT Re-evaluation, 97110-Therapeutic exercises, 97530-  Therapeutic activity, [id number]- Neuromuscular re-education, [phone]- Self Care, [id number]- Manual therapy, [phone]- Gait training, [phone]- Canalith repositioning, [id number]- Aquatic Therapy, [phone]- Electrical stimulation (unattended), [phone]- Electrical stimulation (manual), [id number]- Vasopneumatic device, [id number]- Ultrasound, [id number]- Traction (mechanical), [id number]- Ionotophoresis 4mg /ml Dexamethasone , [id number] (1-2 muscles), [id number] (3+ muscles)- Dry Needling, Patient/Family education, Balance training, Stair training, Taping, Joint mobilization, Joint manipulation, Spinal manipulation, Spinal mobilization, Compression bandaging, Vestibular training, Moist heat, and Biofeedback  PLAN FOR NEXT SESSION: Review HEP; NuStep; 3 way ankle strengthening; BAPS board; assess single leg balance; taping/ manual to ankle   Kristeen Sar, PT 12/28/23 3:01 PM [facility] [address] [place], [place] [address] Phone # [phone] Fax [phone]

## 2023-12-28 ENCOUNTER — Encounter: Payer: Self-pay | Admitting: Physical Therapy

## 2023-12-28 ENCOUNTER — Other Ambulatory Visit: Payer: Self-pay

## 2023-12-28 ENCOUNTER — Ambulatory Visit: Attending: Podiatry | Admitting: Physical Therapy

## 2023-12-28 DIAGNOSIS — M7750 Other enthesopathy of unspecified foot: Secondary | ICD-10-CM | POA: Insufficient documentation

## 2023-12-28 DIAGNOSIS — R600 Localized edema: Secondary | ICD-10-CM | POA: Diagnosis present

## 2023-12-28 DIAGNOSIS — R2689 Other abnormalities of gait and mobility: Secondary | ICD-10-CM | POA: Diagnosis present

## 2023-12-28 DIAGNOSIS — M25572 Pain in left ankle and joints of left foot: Secondary | ICD-10-CM | POA: Diagnosis present

## 2023-12-28 DIAGNOSIS — M6281 Muscle weakness (generalized): Secondary | ICD-10-CM | POA: Insufficient documentation

## 2023-12-30 ENCOUNTER — Ambulatory Visit: Admitting: Physical Therapy

## 2023-12-30 ENCOUNTER — Encounter: Payer: Self-pay | Admitting: Physical Therapy

## 2023-12-30 DIAGNOSIS — R600 Localized edema: Secondary | ICD-10-CM

## 2023-12-30 DIAGNOSIS — M25572 Pain in left ankle and joints of left foot: Secondary | ICD-10-CM | POA: Diagnosis not present

## 2023-12-30 DIAGNOSIS — M6281 Muscle weakness (generalized): Secondary | ICD-10-CM

## 2023-12-30 DIAGNOSIS — R2689 Other abnormalities of gait and mobility: Secondary | ICD-10-CM

## 2023-12-30 NOTE — Therapy (Signed)
 OUTPATIENT PHYSICAL THERAPY LOWER EXTREMITY TREATMENT   Patient Name: Carol Lowery MRN: [id number] DOB:1940/07/02, 83 y.o., female Today's Date: 12/30/2023  END OF SESSION:  PT End of Session - 12/30/23 1712     Visit Number 2    Date for PT Re-Evaluation 02/22/24    Authorization Type Medicare/[employer]na Supplement    Progress Note Due on Visit 10    PT Start Time 1617    PT Stop Time 1700    PT Time Calculation (min) 43 min    Activity Tolerance Patient tolerated treatment well    Behavior During Therapy WFL for tasks assessed/performed           Past Medical History:  Diagnosis Date   Anemia    Arthritis    generalized   Asthma    cough varient asthma;seasonal   Complication of anesthesia    slow to awaken   GERD (gastroesophageal reflux disease)    Head concussion 2018   causes decreased hearing   Headache    Heart murmur    as a child and while pregnant   Hiatal hernia    History of blood transfusion 1970   6 pints after childbirth   Hypothyroidism    Patent foramen ovale    TEE 08/14/15 [facility]): LVEF 55-60%, no RWMA, PFO with minimal right to left shunt, no ASD, mild MR, small fibrinous pericardial effsuion   Renal cyst    per patient benign   Thyroid disease    Past Surgical History:  Procedure Laterality Date   ABDOMINAL HYSTERECTOMY     APPENDECTOMY     appendectomy of left toe     COLONOSCOPY     DIAGNOSTIC LAPAROSCOPY     bilateral knees   ESOPHAGOGASTRODUODENOSCOPY N/A 12/10/2021   Procedure: ESOPHAGOGASTRODUODENOSCOPY (EGD);  Surgeon: Shyrl Linnie KIDD, MD;  Location: [facility] OR;  Service: Thoracic;  Laterality: N/A;   ESOPHAGOGASTRODUODENOSCOPY N/A 04/19/2023   Procedure: ESOPHAGOGASTRODUODENOSCOPY (EGD) WITH SAVORY DILATION;  Surgeon: Shyrl Linnie KIDD, MD;  Location: [facility] OR;  Service: Thoracic;  Laterality: N/A;   EYE SURGERY     cataract removed bilaterally   JOINT REPLACEMENT     knee replacement left Left    RADICAL VAGINAL  HYSTERECTOMY     right carpal tunnel release     TONSILLECTOMY     tonsils removed     UPPER GASTROINTESTINAL ENDOSCOPY     XI ROBOTIC ASSISTED PARAESOPHAGEAL HERNIA REPAIR N/A 12/10/2021   Procedure: XI ROBOTIC ASSISTED PARAESOPHAGEAL HERNIA REPAIR WITH FUNDOPLICATION;  Surgeon: Shyrl Linnie KIDD, MD;  Location: [facility] OR;  Service: Thoracic;  Laterality: N/A;   Patient Active Problem List   Diagnosis Date Noted   S/P repair of paraesophageal hernia 12/10/2021   Acquired hypothyroidism 09/12/2021   Congenital heart disease 09/12/2021   Decreased estrogen level 09/12/2021   Hiatal hernia 09/12/2021   Iron deficiency anemia 09/12/2021   Pure hypercholesterolemia 09/12/2021   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 She has lateral ankle pain after doing the exercises. Pain 5/10. (Ankle) (back 30/10)    From Eval:Patient presents with left ankle pain that began April 2025, but it is a chronic problem. 40 [place] Nothing was broken but it bruised really bad. She just has occasional tendonitis flare ups.  Dr. Gershon gave her a boot to wear a week ago but it really flared up her back pain so she went back to the supportive ankle brace. She likes this better. She elevates her ankle when she sits and ices it every two hours for 20 mins. This does help decrease pain. She is still able to  keep her steps up (her goal is 10,000 per day), but with increased activity the pain is worse.  PERTINENT HISTORY: OA; Hypothyroidism; Hx Left knee replacement;  PAIN:  Are you having pain? Yes: NPRS scale: 5(currently) 8(worst) / 10 Pain location: Lt Medial malleoli and shoots up towards calf Pain description: sharp, shooting Aggravating factors: Touching the area; running multiple errands, going up a curb Relieving factors: ice; elevation Advil when the pain is really bad   PRECAUTIONS: None  RED FLAGS: None   WEIGHT BEARING RESTRICTIONS: No  FALLS:  Has patient fallen in last 6 months? No  LIVING ENVIRONMENT: Lives with: lives with their spouse Lives in: House/apartment Stairs: yes stairs but her room is on the main level; stairs to come into the house Has following equipment at home: Grab bars  OCCUPATION: Retired; worked for a church  PLOF: Independent, Independent with basic ADLs, Independent with household mobility without device, Independent with community mobility without device, Independent with gait, Independent with transfers, and Leisure: Travel; hang out with family  PATIENT GOALS: To improve ROM, strength & function   NEXT MD VISIT: 3 weeks  OBJECTIVE:  Note: Objective measures were completed at Evaluation unless otherwise noted.  DIAGNOSTIC FINDINGS:  Ankle MRI 6/19 Impression: 1. Infiltration of the sinus Tarsi and mechanical stress marrow edema around the subtalar joint/sinus Tarsi 2. Proximal plantar aponeurosis degeneration and small plantar calcaneal spur 3. Thickened intermediate signal anterior talofibular and calcaneofibular ligaments 4. Flexor and peroneus longus/brevis tenosynovitis and lesser extensor tenosynovitis 5. Mild edema of the distal calf muscles possibly use or nerve related 6. Marrow edema at the medial malleolar margin and subcutaneous edema at the inner ankle 7. Hindfoot valgus deformity and suggestion of mild abutment between  the lateral malleolus and posterior outer calcaneus  PATIENT SURVEYS:  LEFS: 54/80 67.5%   COGNITION: Overall cognitive status: Within functional limits for tasks assessed     SENSATION: WFL  EDEMA:  Increased edema Lt medial malleoli    POSTURE: rounded shoulders  PALPATION: Tenderness with palpation around medial malleoli Tenderness with palpation tibialis anterior & calf   LOWER EXTREMITY MNF:EMNF WFL; more discomfort at end range movements    LOWER EXTREMITY MMT: MMT limited due to pain with resistance. Inversion more painful than eversion. Grossly 4-/5   FUNCTIONAL TESTS:  5 times sit to stand: 10.79 sec no UE support (no brace) Timed up and go (TUG): 10.30 sec (no brace; pain)  GAIT: Comments: Antalgic gait; decreased stance on Lt LE  TREATMENT DATE:  12/30/2023 NuStep Level 3 5 mins -PT present to discuss status Seated ankle pumps x 10 on Lt  Seated ankle alphabet x 1 on Lt  Baps board clockwise and counterclockwise 10x each direction  Short foot scrunch on towel 12x  Marble pick up 12 marbles x 1 Seated calf raise 2x8  Ankle Inversion and Eversion with yellow TB 2x10 each foot Manual: Addaday to Rt lumbar paraspinals and glutes for improve muscle elongation and decrease pain Manual: Applied kinesiology tape to Lt ankle to improve swelling    12/28/2023 Initial Evaluation & HEP created  Education on getting a shoe lift on right shoe  PATIENT EDUCATION:  Education details: PT eval findings, anticipated POC, progress with PT, and initial HEP Person educated: Patient Education method: Explanation, Demonstration, and Handouts Education comprehension: verbalized understanding, returned demonstration, and needs further education  HOME EXERCISE PROGRAM: Access Code: [email or web address] URL: [email or web address] Date:  12/30/2023 Prepared by: Kristeen Sar  Exercises - Supine Ankle Pumps in Elevation on Pillows  - 1-2 x daily - 7 x weekly - 2 sets - 10 reps - Seated Ankle Alphabet  - 1-2 x daily - 7 x weekly - 1 sets - Long Sitting Calf Stretch with Strap  - 1-2 x daily - 7 x weekly - 2 sets - 20-30s hold - Seated Ankle Inversion with Resistance and Legs Crossed  - 1 x daily - 7 x weekly - 2 sets - 10 reps - Long Sitting Ankle Eversion with Resistance  - 1 x daily - 7 x weekly - 2 sets - 10 reps - Ankle Inversion with Resistance  - 1 x daily - 7 x weekly - 2 sets - 10 reps  ASSESSMENT:  CLINICAL IMPRESSION: Quintavia presents to first follow up appointment since evaluation. She verbalized compliance with HEP and noted more lateral ankle pain after performing exercises. Educated patient that since her ankle has been immobilized for a few weeks her muscles are getting back to working and functioning. Incorporated gentle ankle strengthening exercises and patient tolerated them well. Updated HEP to include theses. Patient verbalized increased back pain so used addaday to target right lumbar paraspinals and glutes. Overall, patient tolerated treatment session well and should progress well to therapy.    OBJECTIVE IMPAIRMENTS: Abnormal gait, decreased activity tolerance, decreased balance, difficulty walking, decreased ROM, decreased strength, hypomobility, increased edema, impaired perceived functional ability, increased muscle spasms, impaired flexibility, and pain.   ACTIVITY LIMITATIONS: standing, stairs, transfers, and locomotion level  PARTICIPATION LIMITATIONS: meal prep, cleaning, interpersonal relationship, shopping, community activity, and yard work  PERSONAL FACTORS: Age and Time since onset of injury/illness/exacerbation are also affecting patient's functional outcome.   REHAB POTENTIAL: Good  CLINICAL DECISION MAKING: Evolving/moderate complexity  EVALUATION COMPLEXITY: Moderate   GOALS: Goals  reviewed with patient? Yes  SHORT TERM GOALS: Target date: 01/25/2024  Patient will be independent with initial HEP. Baseline:  Goal status: INITIAL  2.  Patient will report > or = to 30%  improvement in left ankle pain since starting PT. Baseline:  Goal status: INITIAL   3.  Patient will be able to ascend/ descend a curb with LRAD for improved community mobility. Baseline:  Goal status: INITIAL   LONG TERM GOALS: Target date: 02/22/2024  Patient will demonstrate independence in advanced HEP. Baseline:  Goal status: INITIAL  2.  Patient will report > or = to 60% improvement in left ankle pain since starting PT. Baseline:  Goal status: INITIAL  3.  Patient will  verbalize and demonstrate self-care strategies to manage pain including tissue mobility practices and change of position. Baseline:  Goal status: INITIAL  4.  Patient will be able to walk > or = to 10,000 steps per day with < or = to 2/10 left ankle discomfort. Baseline:  Goal status: INITIAL  5.  Patient will score > or = to 63/80 on LEFS due to improved ability to perform functional activities.  Baseline: 54/80 Goal status: INITIAL   PLAN:  PT FREQUENCY: 2x/week  PT DURATION: 8 weeks  PLANNED INTERVENTIONS: 97164- PT Re-evaluation, 97110-Therapeutic exercises, 97530- Therapeutic activity, 97112- Neuromuscular re-education, 97535- Self Care, [id number]- Manual therapy, [phone]- Gait training, [phone]- Canalith repositioning, [phone]- Aquatic Therapy, [phone]- Electrical stimulation (unattended), [phone]- Electrical stimulation (manual), [id number]- Vasopneumatic device, [id number]- Ultrasound, [id number]- Traction (mechanical), [id number]- Ionotophoresis 4mg /ml Dexamethasone , 20560 (1-2 muscles), [id number] (3+ muscles)- Dry Needling, Patient/Family education, Balance training, Stair training, Taping, Joint mobilization, Joint manipulation, Spinal manipulation, Spinal mobilization, Compression bandaging, Vestibular training, Moist heat, and  Biofeedback  PLAN FOR NEXT SESSION: assess response to taping for edema; continue ankle ROM and gentle strengthening   Kristeen Sar, PT 12/30/23 5:13 PM [facility] [address] [place], [place] [address] Phone # [phone] Fax [phone]

## 2024-01-06 ENCOUNTER — Ambulatory Visit: Admitting: Physical Therapy

## 2024-01-06 DIAGNOSIS — M25572 Pain in left ankle and joints of left foot: Secondary | ICD-10-CM | POA: Diagnosis not present

## 2024-01-06 DIAGNOSIS — R600 Localized edema: Secondary | ICD-10-CM

## 2024-01-06 DIAGNOSIS — R2689 Other abnormalities of gait and mobility: Secondary | ICD-10-CM

## 2024-01-06 DIAGNOSIS — M6281 Muscle weakness (generalized): Secondary | ICD-10-CM

## 2024-01-06 NOTE — Therapy (Signed)
 OUTPATIENT PHYSICAL THERAPY LOWER EXTREMITY TREATMENT   Patient Name: Carol Lowery MRN: [id number] DOB:08/08/1940, 83 y.o., female Today's Date: 01/06/2024  END OF SESSION:  PT End of Session - 01/06/24 1026     Visit Number 3    Date for PT Re-Evaluation 02/22/24    Authorization Type Medicare/[employer]na Supplement    Progress Note Due on Visit 10    PT Start Time 1020    PT Stop Time 1100    PT Time Calculation (min) 40 min    Activity Tolerance Patient tolerated treatment well           Past Medical History:  Diagnosis Date   Anemia    Arthritis    generalized   Asthma    cough varient asthma;seasonal   Complication of anesthesia    slow to awaken   GERD (gastroesophageal reflux disease)    Head concussion 2018   causes decreased hearing   Headache    Heart murmur    as a child and while pregnant   Hiatal hernia    History of blood transfusion 1970   6 pints after childbirth   Hypothyroidism    Patent foramen ovale    TEE 08/14/15 [facility]): LVEF 55-60%, no RWMA, PFO with minimal right to left shunt, no ASD, mild MR, small fibrinous pericardial effsuion   Renal cyst    per patient benign   Thyroid disease    Past Surgical History:  Procedure Laterality Date   ABDOMINAL HYSTERECTOMY     APPENDECTOMY     appendectomy of left toe     COLONOSCOPY     DIAGNOSTIC LAPAROSCOPY     bilateral knees   ESOPHAGOGASTRODUODENOSCOPY N/A 12/10/2021   Procedure: ESOPHAGOGASTRODUODENOSCOPY (EGD);  Surgeon: Shyrl Linnie KIDD, MD;  Location: [facility];  Service: Thoracic;  Laterality: N/A;   ESOPHAGOGASTRODUODENOSCOPY N/A 04/19/2023   Procedure: ESOPHAGOGASTRODUODENOSCOPY (EGD) WITH SAVORY DILATION;  Surgeon: Shyrl Linnie KIDD, MD;  Location: [facility];  Service: Thoracic;  Laterality: N/A;   EYE SURGERY     cataract removed bilaterally   JOINT REPLACEMENT     knee replacement left Left    RADICAL VAGINAL HYSTERECTOMY     right carpal tunnel release      TONSILLECTOMY     tonsils removed     UPPER GASTROINTESTINAL ENDOSCOPY     XI ROBOTIC ASSISTED PARAESOPHAGEAL HERNIA REPAIR N/A 12/10/2021   Procedure: XI ROBOTIC ASSISTED PARAESOPHAGEAL HERNIA REPAIR WITH FUNDOPLICATION;  Surgeon: Shyrl Linnie KIDD, MD;  Location: [facility] OR;  Service: Thoracic;  Laterality: N/A;   Patient Active Problem List   Diagnosis Date Noted   S/P repair of paraesophageal hernia 12/10/2021   Acquired hypothyroidism 09/12/2021   Congenital heart disease 09/12/2021   Decreased estrogen level 09/12/2021   Hiatal hernia 09/12/2021   Iron deficiency anemia 09/12/2021   Pure hypercholesterolemia 09/12/2021   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   Patent foramen ovale 07/26/2020   PTTD (posterior tibial tendon dysfunction) 05/22/2019    PCP:   Chrystal Lamarr RAMAN, MD    REFERRING PROVIDER: [name], DPM  REFERRING DIAG: M77.50 (ICD-10-CM) - Tendonitis of ankle  THERAPY DIAG:  Pain in left ankle and joints of left foot  Muscle weakness (generalized)  Other abnormalities of gait and mobility  Localized edema  Rationale for Evaluation and Treatment: Rehabilitation  ONSET DATE: Chronic flare up April 2025  SUBJECTIVE:   SUBJECTIVE STATEMENT: Patient reports her back is 50% better.  Wearing ankle brace out of the  house but not inside.  Continues to apply ice regularly for swelling.  States the taping for edema came off quickly.  States she's going to have an x-ray to rule out fracture of the medial malleolus since it's so tender Goes by Carol Lowery    From Eval:Patient presents with left ankle pain that began April 2025, but it is a chronic problem. 40 years [place]. Nothing was broken but it bruised really bad. She just has occasional tendonitis flare ups.  Dr. Gershon gave her a boot to wear a week ago but it really flared up her back pain so she went back to the supportive ankle brace. She likes this better. She  elevates her ankle when she sits and ices it every two hours for 20 mins. This does help decrease pain. She is still able to keep her steps up (her goal is 10,000 per day), but with increased activity the pain is worse.  PERTINENT HISTORY: OA; Hypothyroidism; Hx Left knee replacement; years ago left toe fractures when dropped a vacumn cleaner attachment on them  PAIN:  Are you having pain? Yes: NPRS scale: 5 Pain location: Lt Medial malleoli and feels tight top of the ankle  Pain description: sharp, shooting Aggravating factors: Touching the area; running multiple errands, going up a curb Relieving factors: ice; elevation Advil when the pain is really bad   PRECAUTIONS: None  RED FLAGS: None   WEIGHT BEARING RESTRICTIONS: No  FALLS:  Has patient fallen in last 6 months? No  LIVING ENVIRONMENT: Lives with: lives with their spouse Lives in: House/apartment Stairs: yes stairs but her room is on the main level; stairs to come into the house Has following equipment at home: Grab bars  OCCUPATION: Retired; worked for a church  PLOF: Independent, Independent with basic ADLs, Independent with household mobility without device, Independent with community mobility without device, Independent with gait, Independent with transfers, and Leisure: Travel; hang out with family  PATIENT GOALS: To improve ROM, strength & function   NEXT MD VISIT: 3 weeks  OBJECTIVE:  Note: Objective measures were completed at Evaluation unless otherwise noted.  DIAGNOSTIC FINDINGS:  Ankle MRI 6/19 Impression: 1. Infiltration of the sinus Tarsi and mechanical stress marrow edema around the subtalar joint/sinus Tarsi 2. Proximal plantar aponeurosis degeneration and small plantar calcaneal spur 3. Thickened intermediate signal anterior talofibular and calcaneofibular ligaments 4. Flexor and peroneus longus/brevis tenosynovitis and lesser extensor tenosynovitis 5. Mild edema of the distal calf muscles  possibly use or nerve related 6. Marrow edema at the medial malleolar margin and subcutaneous edema at the inner ankle 7. Hindfoot valgus deformity and suggestion of mild abutment between the lateral malleolus and posterior outer calcaneus  PATIENT SURVEYS:  LEFS: 54/80 67.5%   COGNITION: Overall cognitive status: Within functional limits for tasks assessed     SENSATION: WFL  EDEMA:  Increased edema Lt medial malleoli    POSTURE: rounded shoulders  PALPATION: Tenderness with palpation around medial malleoli Tenderness with palpation tibialis anterior & calf   LOWER EXTREMITY MNF:EMNF WFL; more discomfort at end range movements    LOWER EXTREMITY MMT: MMT limited due to pain with resistance. Inversion more painful than eversion. Grossly 4-/5   FUNCTIONAL TESTS:  5 times sit to stand: 10.79 sec no UE support (no brace) Timed up and go (TUG): 10.30 sec (no brace; pain)  GAIT: Comments: Antalgic gait; decreased stance on Lt LE  TREATMENT DATE:  01/06/2024 NuStep Level 3 7 mins -PT present to discuss status (towel roll lumbar) Patient education on affected soft tissue around the medial malleolus Seated short foot off with pen under the arch (external cue) (Added to HEP- see below) Towel scrunches 10x Seated ball squeeze between heels with heel raise 15x Ankle inversion yellow band more of an isometric 5 sec holds 8x Gastroc runner's stretch with 30 sec holds (verbal cues for straight foot positioning) right/left Toe/plantar fascial stretch on wall 30 sec holds right/left (Added to HEP- see below)    12/30/2023 NuStep Level 3 7 mins -PT present to discuss status Seated ankle pumps x 10 on Lt  Seated ankle alphabet x 1 on Lt  Baps board clockwise and counterclockwise 10x each direction  Short foot scrunch on towel 12x  Marble pick up 12  marbles x 1 Seated calf raise 2x8  Ankle Inversion and Eversion with yellow TB 2x10 each foot Manual: Addaday to Rt lumbar paraspinals and glutes for improve muscle elongation and decrease pain Manual: Applied kinesiology tape to Lt ankle to improve swelling    12/28/2023 Initial Evaluation & HEP created  Education on getting a shoe lift on right shoe  PATIENT EDUCATION:  Education details: PT eval findings, anticipated POC, progress with PT, and initial HEP Person educated: Patient Education method: Explanation, Demonstration, and Handouts Education comprehension: verbalized understanding, returned demonstration, and needs further education  HOME EXERCISE PROGRAM: Access Code: [email or web address] URL: [email or web address] Date: 01/06/2024 Prepared by: Glade Pesa  Exercises - Supine Ankle Pumps in Elevation on Pillows  - 1-2 x daily - 7 x weekly - 2 sets - 10 reps - Seated Ankle Alphabet  - 1-2 x daily - 7 x weekly - 1 sets - Long Sitting Calf Stretch with Strap  - 1-2 x daily - 7 x weekly - 2 sets - 20-30s hold - Seated Ankle Inversion with Resistance and Legs Crossed  - 1 x daily - 7 x weekly - 2 sets - 10 reps - Long Sitting Ankle Eversion with Resistance  - 1 x daily - 7 x weekly - 2 sets - 10 reps - Ankle Inversion with Resistance  - 1 x daily - 7 x weekly - 2 sets - 10 reps - Gastroc Stretch with Foot at Wall  - 1 x daily - 7 x weekly - 1 sets - 2-3 reps - 30 hold - Seated Arch Lifts  - 1 x daily - 7 x weekly - 1 sets - 10 reps  ASSESSMENT:  CLINICAL IMPRESSION: Carol Lowery demonstrates good compliance with her current HEP. We added 2 new ex's for home including a seated short foot motor neuromuscular activation and toe extension/gastroc lengthening to avoid overload of the posterior tibial tendon.  Moderate pes planus present even in nonweightbearing. Therapist providing verbal cues to optimize technique with  exercises in order to achieve the greatest benefit.        OBJECTIVE IMPAIRMENTS: Abnormal gait, decreased activity tolerance, decreased balance, difficulty walking, decreased ROM, decreased strength, hypomobility, increased edema, impaired perceived functional ability, increased muscle spasms, impaired flexibility, and pain.   ACTIVITY LIMITATIONS: standing, stairs, transfers, and locomotion level  PARTICIPATION LIMITATIONS: meal prep, cleaning, interpersonal relationship, shopping, community activity, and yard work  PERSONAL FACTORS: Age and Time since onset of injury/illness/exacerbation are also affecting patient's functional outcome.   REHAB POTENTIAL: Good  CLINICAL DECISION MAKING: Evolving/moderate complexity  EVALUATION COMPLEXITY: Moderate   GOALS: Goals reviewed with patient? Yes  SHORT TERM GOALS:  Target date: 01/25/2024  Patient will be independent with initial HEP. Baseline:  Goal status: INITIAL  2.  Patient will report > or = to 30%  improvement in left ankle pain since starting PT. Baseline:  Goal status: INITIAL   3.  Patient will be able to ascend/ descend a curb with LRAD for improved community mobility. Baseline:  Goal status: INITIAL   LONG TERM GOALS: Target date: 02/22/2024  Patient will demonstrate independence in advanced HEP. Baseline:  Goal status: INITIAL  2.  Patient will report > or = to 60% improvement in left ankle pain since starting PT. Baseline:  Goal status: INITIAL  3.  Patient will verbalize and demonstrate self-care strategies to manage pain including tissue mobility practices and change of position. Baseline:  Goal status: INITIAL  4.  Patient will be able to walk > or = to 10,000 steps per day with < or = to 2/10 left ankle discomfort. Baseline:  Goal status: INITIAL  5.  Patient will score > or = to 63/80 on LEFS due to improved ability to perform functional activities.  Baseline: 54/80 Goal status: INITIAL   PLAN:  PT FREQUENCY: 2x/week  PT DURATION: 8  weeks  PLANNED INTERVENTIONS: 97164- PT Re-evaluation, 97110-Therapeutic exercises, 97530- Therapeutic activity, 97112- Neuromuscular re-education, 97535- Self Care, [id number]- Manual therapy, [phone]- Gait training, [phone]- Canalith repositioning, [phone]- Aquatic Therapy, [phone]- Electrical stimulation (unattended), [phone]- Electrical stimulation (manual), [id number]- Vasopneumatic device, [id number]- Ultrasound, [id number]- Traction (mechanical), [id number]- Ionotophoresis 4mg /ml Dexamethasone , [id number] (1-2 muscles), [id number] (3+ muscles)- Dry Needling, Patient/Family education, Balance training, Stair training, Taping, Joint mobilization, Joint manipulation, Spinal manipulation, Spinal mobilization, Compression bandaging, Vestibular training, Moist heat, and Biofeedback  PLAN FOR NEXT SESSION:  continue ankle, toe extension ROM and gentle strengthening including foot intrinsics   Glade Pesa, PT 01/06/24 2:09 PM Phone: [phone] Fax: [phone] [facility] [address], [address] [place], [place] [address] Phone # [phone] Fax [phone]

## 2024-01-14 ENCOUNTER — Ambulatory Visit: Admitting: Physical Therapy

## 2024-01-14 DIAGNOSIS — M25572 Pain in left ankle and joints of left foot: Secondary | ICD-10-CM

## 2024-01-14 DIAGNOSIS — M6281 Muscle weakness (generalized): Secondary | ICD-10-CM

## 2024-01-14 DIAGNOSIS — R2689 Other abnormalities of gait and mobility: Secondary | ICD-10-CM

## 2024-01-14 NOTE — Therapy (Signed)
 OUTPATIENT PHYSICAL THERAPY LOWER EXTREMITY TREATMENT   Patient Name: Carol Lowery MRN: [id number] DOB:[birth year], 83 y.o., female Today's Date: 01/14/2024  END OF SESSION:  PT End of Session - 01/14/24 1025     Visit Number 4    Date for PT Re-Evaluation 02/22/24    Authorization Type Medicare/[employer] Supplement    Progress Note Due on Visit 10    PT Start Time 1021    PT Stop Time 1100    PT Time Calculation (min) 39 min    Activity Tolerance Patient tolerated treatment well           Past Medical History:  Diagnosis Date   Anemia    Arthritis    generalized   Asthma    cough varient asthma;seasonal   Complication of anesthesia    slow to awaken   GERD (gastroesophageal reflux disease)    Head concussion 2018   causes decreased hearing   Headache    Heart murmur    as a child and while pregnant   Hiatal hernia    History of blood transfusion 1970   6 pints after childbirth   Hypothyroidism    Patent foramen ovale    TEE 08/14/15 [facility]): LVEF 55-60%, no RWMA, PFO with minimal right to left shunt, no ASD, mild MR, small fibrinous pericardial effsuion   Renal cyst    per patient benign   Thyroid disease    Past Surgical History:  Procedure Laterality Date   ABDOMINAL HYSTERECTOMY     APPENDECTOMY     appendectomy of left toe     COLONOSCOPY     DIAGNOSTIC LAPAROSCOPY     bilateral knees   ESOPHAGOGASTRODUODENOSCOPY N/A 12/10/2021   Procedure: ESOPHAGOGASTRODUODENOSCOPY (EGD);  Surgeon: Shyrl Linnie KIDD, MD;  Location: [facility] OR;  Service: Thoracic;  Laterality: N/A;   ESOPHAGOGASTRODUODENOSCOPY N/A 04/19/2023   Procedure: ESOPHAGOGASTRODUODENOSCOPY (EGD) WITH SAVORY DILATION;  Surgeon: Shyrl Linnie KIDD, MD;  Location: [facility] OR;  Service: Thoracic;  Laterality: N/A;   EYE SURGERY     cataract removed bilaterally   JOINT REPLACEMENT     knee replacement left Left    RADICAL VAGINAL HYSTERECTOMY     right carpal tunnel release      TONSILLECTOMY     tonsils removed     UPPER GASTROINTESTINAL ENDOSCOPY     XI ROBOTIC ASSISTED PARAESOPHAGEAL HERNIA REPAIR N/A 12/10/2021   Procedure: XI ROBOTIC ASSISTED PARAESOPHAGEAL HERNIA REPAIR WITH FUNDOPLICATION;  Surgeon: Shyrl Linnie KIDD, MD;  Location: [facility] OR;  Service: Thoracic;  Laterality: N/A;   Patient Active Problem List   Diagnosis Date Noted   S/P repair of paraesophageal hernia 12/10/2021   Acquired hypothyroidism 09/12/2021   Congenital heart disease 09/12/2021   Decreased estrogen level 09/12/2021   Hiatal hernia 09/12/2021   Iron deficiency anemia 09/12/2021   Pure hypercholesterolemia 09/12/2021   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  My back  is better. Last time after therapy I hurt the rest of the day.  I see the doctor on August 4th. I feel it across the foot.  I've been working on the arch lifts.      Goes by [name]    From Eval:Patient presents with left ankle pain that began April 2025, but it is a chronic problem. 40 years [place]. Nothing was broken but it bruised really bad. She just has occasional tendonitis flare ups.  Dr. Gershon gave her a boot to wear a week ago but it really flared up her back pain so she went back to the supportive ankle brace. She likes this better. She elevates her ankle when she sits and ices it every two hours for  20 mins. This does help decrease pain. She is still able to keep her steps up (her goal is 10,000 per day), but with increased activity the pain is worse.  PERTINENT HISTORY: OA; Hypothyroidism; Hx Left knee replacement; years ago left toe fractures when dropped a vacumn cleaner attachment on them  PAIN:  Are you having pain? Yes: NPRS scale: not bad at all 4/10, during the night 8/10 Pain location: Lt Medial malleoli and feels tight top of the ankle  Pain description: sharp, shooting Aggravating factors: Touching the area; running multiple errands, going up a curb Relieving factors: ice; elevation Advil when the pain is really bad   PRECAUTIONS: None  RED FLAGS: None   WEIGHT BEARING RESTRICTIONS: No  FALLS:  Has patient fallen in last 6 months? No  LIVING ENVIRONMENT: Lives with: lives with their spouse Lives in: House/apartment Stairs: yes stairs but her room is on the main level; stairs to come into the house Has following equipment at home: Grab bars  OCCUPATION: Retired; worked for a church  PLOF: Independent, Independent with basic ADLs, Independent with household mobility without device, Independent with community mobility without device, Independent with gait, Independent with transfers, and Leisure: Travel; hang out with family  PATIENT GOALS: To improve ROM, strength & function   NEXT MD VISIT: 3 weeks  OBJECTIVE:  Note: Objective measures were completed at Evaluation unless otherwise noted.  DIAGNOSTIC FINDINGS:  Ankle MRI 6/19 Impression: 1. Infiltration of the sinus Tarsi and mechanical stress marrow edema around the subtalar joint/sinus Tarsi 2. Proximal plantar aponeurosis degeneration and small plantar calcaneal spur 3. Thickened intermediate signal anterior talofibular and calcaneofibular ligaments 4. Flexor and peroneus longus/brevis tenosynovitis and lesser extensor tenosynovitis 5. Mild edema of the distal calf muscles possibly use or nerve  related 6. Marrow edema at the medial malleolar margin and subcutaneous edema at the inner ankle 7. Hindfoot valgus deformity and suggestion of mild abutment between the lateral malleolus and posterior outer calcaneus  PATIENT SURVEYS:  LEFS: 54/80 67.5%   COGNITION: Overall cognitive status: Within functional limits for tasks assessed     SENSATION: WFL  EDEMA:  Increased edema Lt medial malleoli    POSTURE: rounded shoulders  PALPATION: Tenderness with palpation around medial malleoli Tenderness with palpation tibialis anterior & calf   LOWER EXTREMITY MNF:EMNF WFL; more discomfort at end range movements    LOWER EXTREMITY MMT: MMT limited due to pain with resistance. Inversion more painful than eversion. Grossly 4-/5   FUNCTIONAL TESTS:  5 times sit to stand: 10.79 sec no UE support (no brace) Timed up and go (TUG): 10.30 sec (no brace; pain)  GAIT: Comments: Antalgic gait; decreased stance on Lt LE  TREATMENT DATE:  01/14/2024 NuStep Level 3 6 mins -PT present to discuss status Towel scrunches seated 8x Seated arch lifts 8x Seated yellow plantarflexion/inversion 15x  Seated ball squeeze between heels with heel raise 15x Standing skiers 10sec hold 6x (like leaning forward in snow boots) Standing arch lifts 10x sec hold 5x Y touches with WB on left keeping neutral foot 10x Manual therapy: gentle soft tissue mobilization performed with LE elevated to gastrocs, soleus and anterior tib; avoided posterior tib secondary to high sensitivity in this area  01/06/2024 NuStep Level 3 7 mins -PT present to discuss status (towel roll lumbar) Patient education on affected soft tissue around the medial malleolus Seated short foot off with pen under the arch (external cue) (Added to HEP- see below) Towel scrunches 10x Seated ball squeeze between  heels with heel raise 15x Ankle inversion yellow band more of an isometric 5 sec holds 8x Gastroc runner's stretch with 30 sec holds (verbal cues for straight foot positioning) right/left Toe/plantar fascial stretch on wall 30 sec holds right/left (Added to HEP- see below)    12/30/2023 NuStep Level 3 7 mins -PT present to discuss status Seated ankle pumps x 10 on Lt  Seated ankle alphabet x 1 on Lt  Baps board clockwise and counterclockwise 10x each direction  Short foot scrunch on towel 12x  Marble pick up 12 marbles x 1 Seated calf raise 2x8  Ankle Inversion and Eversion with yellow TB 2x10 each foot Manual: Addaday to Rt lumbar paraspinals and glutes for improve muscle elongation and decrease pain Manual: Applied kinesiology tape to Lt ankle to improve swelling    12/28/2023 Initial Evaluation & HEP created  Education on getting a shoe lift on right shoe  PATIENT EDUCATION:  Education details: PT eval findings, anticipated POC, progress with PT, and initial HEP Person educated: Patient Education method: Explanation, Demonstration, and Handouts Education comprehension: verbalized understanding, returned demonstration, and needs further education  HOME EXERCISE PROGRAM: Access Code: [email or web address] URL: [email or web address] Date: 01/06/2024 Prepared by: Glade Pesa  Exercises - Supine Ankle Pumps in Elevation on Pillows  - 1-2 x daily - 7 x weekly - 2 sets - 10 reps - Seated Ankle Alphabet  - 1-2 x daily - 7 x weekly - 1 sets - Long Sitting Calf Stretch with Strap  - 1-2 x daily - 7 x weekly - 2 sets - 20-30s hold - Seated Ankle Inversion with Resistance and Legs Crossed  - 1 x daily - 7 x weekly - 2 sets - 10 reps - Long Sitting Ankle Eversion with Resistance  - 1 x daily - 7 x weekly - 2 sets - 10 reps - Ankle Inversion with Resistance  - 1 x daily - 7 x weekly - 2 sets - 10 reps - Gastroc Stretch with Foot at Wall  - 1 x daily - 7 x weekly - 1 sets - 2-3 reps -  30 hold - Seated Arch Lifts  - 1 x daily - 7 x weekly - 1 sets - 10 reps  ASSESSMENT:  CLINICAL IMPRESSION: Discussed modifications to ex's secondary to reports of increased pain following exercise including less forceful stretching on the wall and longer holds with fewer repetitions.  She is able to actively hold her foot in a neutral position for 10 sec in standing (normally in pes planus) but she is not able to sustain this for longer periods of time.  She continues to be very tender around the medial malleolus with moderate edema.  Therapist monitoring response to all interventions and modifying treatment accordingly.       OBJECTIVE IMPAIRMENTS: Abnormal gait, decreased activity tolerance, decreased balance, difficulty walking, decreased ROM, decreased strength, hypomobility, increased edema, impaired perceived functional ability, increased muscle spasms, impaired flexibility, and pain.   ACTIVITY LIMITATIONS: standing, stairs, transfers, and locomotion level  PARTICIPATION LIMITATIONS: meal prep, cleaning, interpersonal relationship, shopping, community activity, and yard work  PERSONAL FACTORS: Age and Time since onset of injury/illness/exacerbation are also affecting patient's functional outcome.   REHAB POTENTIAL: Good  CLINICAL DECISION MAKING: Evolving/moderate complexity  EVALUATION COMPLEXITY: Moderate   GOALS: Goals reviewed with patient? Yes  SHORT TERM GOALS: Target date: 01/25/2024  Patient will be independent with initial HEP. Baseline:  Goal status: INITIAL  2.  Patient will report > or = to 30%  improvement in left ankle pain since starting PT. Baseline:  Goal status: INITIAL   3.  Patient will be able to ascend/ descend a curb with LRAD for improved community mobility. Baseline:  Goal status: INITIAL   LONG TERM GOALS: Target date: 02/22/2024  Patient will demonstrate independence in advanced HEP. Baseline:  Goal status: INITIAL  2.  Patient will  report > or = to 60% improvement in left ankle pain since starting PT. Baseline:  Goal status: INITIAL  3.  Patient will verbalize and demonstrate self-care strategies to manage pain including tissue mobility practices and change of position. Baseline:  Goal status: INITIAL  4.  Patient will be able to walk > or = to 10,000 steps per day with < or = to 2/10 left ankle discomfort. Baseline:  Goal status: INITIAL  5.  Patient will score > or = to 63/80 on LEFS due to improved ability to perform functional activities.  Baseline: 54/80 Goal status: INITIAL   PLAN:  PT FREQUENCY: 2x/week  PT DURATION: 8 weeks  PLANNED INTERVENTIONS: 97164- PT Re-evaluation, 97110-Therapeutic exercises, 97530- Therapeutic activity, 97112- Neuromuscular re-education, 97535- Self Care, [id number]- Manual therapy, [phone]- Gait training, [phone]- Canalith repositioning, [phone]- Aquatic Therapy, [phone]- Electrical stimulation (unattended), [phone]- Electrical stimulation (manual), [id number]- Vasopneumatic device, [id number]- Ultrasound, [id number]- Traction (mechanical), [id number]- Ionotophoresis 4mg /ml Dexamethasone , [id number] (1-2 muscles), [id number] (3+ muscles)- Dry Needling, Patient/Family education, Balance training, Stair training, Taping, Joint mobilization, Joint manipulation, Spinal manipulation, Spinal mobilization, Compression bandaging, Vestibular training, Moist heat, and Biofeedback  PLAN FOR NEXT SESSION:  continue ankle, toe extension ROM and gentle strengthening including foot intrinsics  Glade Pesa, PT 01/14/24 12:09 PM Phone: [phone] Fax: [phone]  [facility] [address] [place], [place] [address] Phone # [phone] Fax [phone]

## 2024-01-18 ENCOUNTER — Encounter: Payer: Self-pay | Admitting: Physical Therapy

## 2024-01-18 ENCOUNTER — Ambulatory Visit: Admitting: Physical Therapy

## 2024-01-18 DIAGNOSIS — R2689 Other abnormalities of gait and mobility: Secondary | ICD-10-CM

## 2024-01-18 DIAGNOSIS — M25572 Pain in left ankle and joints of left foot: Secondary | ICD-10-CM | POA: Diagnosis not present

## 2024-01-18 DIAGNOSIS — R600 Localized edema: Secondary | ICD-10-CM

## 2024-01-18 DIAGNOSIS — M6281 Muscle weakness (generalized): Secondary | ICD-10-CM

## 2024-01-18 NOTE — Therapy (Signed)
 OUTPATIENT PHYSICAL THERAPY LOWER EXTREMITY TREATMENT   Patient Name: Carol Lowery MRN: [id number] DOB:05/30/1941, 83 y.o., female Today's Date: 01/18/2024  END OF SESSION:  PT End of Session - 01/18/24 1209     Visit Number 5    Date for PT Re-Evaluation 02/22/24    Authorization Type Medicare/[employer]etna Supplement    Progress Note Due on Visit 10    PT Start Time 1104    PT Stop Time 1153    PT Time Calculation (min) 49 min    Activity Tolerance Patient tolerated treatment well    Behavior During Therapy WFL for tasks assessed/performed            Past Medical History:  Diagnosis Date   Anemia    Arthritis    generalized   Asthma    cough varient asthma;seasonal   Complication of anesthesia    slow to awaken   GERD (gastroesophageal reflux disease)    Head concussion 2018   causes decreased hearing   Headache    Heart murmur    as a child and while pregnant   Hiatal hernia    History of blood transfusion 1970   6 pints after childbirth   Hypothyroidism    Patent foramen ovale    TEE 08/14/15 [facility]): LVEF 55-60%, no RWMA, PFO with minimal right to left shunt, no ASD, mild MR, small fibrinous pericardial effsuion   Renal cyst    per patient benign   Thyroid disease    Past Surgical History:  Procedure Laterality Date   ABDOMINAL HYSTERECTOMY     APPENDECTOMY     appendectomy of left toe     COLONOSCOPY     DIAGNOSTIC LAPAROSCOPY     bilateral knees   ESOPHAGOGASTRODUODENOSCOPY N/A 12/10/2021   Procedure: ESOPHAGOGASTRODUODENOSCOPY (EGD);  Surgeon: Carol Linnie KIDD, MD;  Location: [facility] OR;  Service: Thoracic;  Laterality: N/A;   ESOPHAGOGASTRODUODENOSCOPY N/A 04/19/2023   Procedure: ESOPHAGOGASTRODUODENOSCOPY (EGD) WITH SAVORY DILATION;  Surgeon: Carol Linnie KIDD, MD;  Location: [facility] OR;  Service: Thoracic;  Laterality: N/A;   EYE SURGERY     cataract removed bilaterally   JOINT REPLACEMENT     knee replacement left Left    RADICAL VAGINAL  HYSTERECTOMY     right carpal tunnel release     TONSILLECTOMY     tonsils removed     UPPER GASTROINTESTINAL ENDOSCOPY     XI ROBOTIC ASSISTED PARAESOPHAGEAL HERNIA REPAIR N/A 12/10/2021   Procedure: XI ROBOTIC ASSISTED PARAESOPHAGEAL HERNIA REPAIR WITH FUNDOPLICATION;  Surgeon: Carol Linnie KIDD, MD;  Location: [facility] OR;  Service: Thoracic;  Laterality: N/A;   Patient Active Problem List   Diagnosis Date Noted   S/P repair of paraesophageal hernia 12/10/2021   Acquired hypothyroidism 09/12/2021   Congenital heart disease 09/12/2021   Decreased estrogen level 09/12/2021   Hiatal hernia 09/12/2021   Iron deficiency anemia 09/12/2021   Pure hypercholesterolemia 09/12/2021   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 Ankle pain is 3-4/10. She only walked 6,000 steps yesterday and that might have contributed to her low pain levels. Her back is aggravating her today she is having 6/10 pain.    Goes by [name]    From Eval:Patient presents with left ankle pain that began April 2025, but it is a chronic problem. 40 [place]. Nothing was broken but it bruised really bad. She just has occasional tendonitis flare ups.  Dr. Gershon gave her a boot to wear a week ago but it really flared up her back pain so she went back to the supportive ankle brace. She likes this better. She elevates her ankle  when she sits and ices it every two hours for 20 mins. This does help decrease pain. She is still able to keep her steps up (her goal is 10,000 per day), but with increased activity the pain is worse.  PERTINENT HISTORY: OA; Hypothyroidism; Hx Left knee replacement; years ago left toe fractures when dropped a vacumn cleaner attachment on them  PAIN:  Are you having pain? Yes: NPRS scale: not bad at all 4/10, during the night 8/10 Pain location: Lt Medial malleoli and feels tight top of the ankle  Pain description: sharp, shooting Aggravating factors: Touching the area; running multiple errands, going up a curb Relieving factors: ice; elevation Advil when the pain is really bad   PRECAUTIONS: None  RED FLAGS: None   WEIGHT BEARING RESTRICTIONS: No  FALLS:  Has patient fallen in last 6 months? No  LIVING ENVIRONMENT: Lives with: lives with their spouse Lives in: House/apartment Stairs: yes stairs but her room is on the main level; stairs to come into the house Has following equipment at home: Grab bars  OCCUPATION: Retired; worked for a church  PLOF: Independent, Independent with basic ADLs, Independent with household mobility without device, Independent with community mobility without device, Independent with gait, Independent with transfers, and Leisure: Travel; hang out with family  PATIENT GOALS: To improve ROM, strength & function   NEXT MD VISIT: 3 weeks  OBJECTIVE:  Note: Objective measures were completed at Evaluation unless otherwise noted.  DIAGNOSTIC FINDINGS:  Ankle MRI 6/19 Impression: 1. Infiltration of the sinus Tarsi and mechanical stress marrow edema around the subtalar joint/sinus Tarsi 2. Proximal plantar aponeurosis degeneration and small plantar calcaneal spur 3. Thickened intermediate signal anterior talofibular and calcaneofibular ligaments 4. Flexor and peroneus longus/brevis tenosynovitis and lesser extensor tenosynovitis 5. Mild edema of the  distal calf muscles possibly use or nerve related 6. Marrow edema at the medial malleolar margin and subcutaneous edema at the inner ankle 7. Hindfoot valgus deformity and suggestion of mild abutment between the lateral malleolus and posterior outer calcaneus  PATIENT SURVEYS:  LEFS: 54/80 67.5%   COGNITION: Overall cognitive status: Within functional limits for tasks assessed     SENSATION: WFL  EDEMA:  Increased edema Lt medial malleoli    POSTURE: rounded shoulders  PALPATION: Tenderness with palpation around medial malleoli Tenderness with palpation tibialis anterior & calf   LOWER EXTREMITY MNF:EMNF WFL; more discomfort at end range movements    LOWER EXTREMITY MMT: MMT limited due to pain with resistance. Inversion more painful than eversion. Grossly 4-/5   FUNCTIONAL TESTS:  5 times sit to stand: 10.79 sec no UE support (no brace) Timed up and go (TUG): 10.30 sec (no brace; pain)  GAIT: Comments: Antalgic gait; decreased stance on Lt LE  TREATMENT DATE: 01/18/2024 NuStep Level 5 6 mins -PT present to discuss status Seated ankle inversion/ eversion with red TB 2 x 10 each Seated arch lifts x10 Standing arch lifts x 10 Side stepping with yellow TB around arches x 2 laps in // bars Standing heel raise on airex 2 x 10 Seated ball squeeze between heels with heel raise 2x10 Seated ankle PF/DF with rocker board x 2 mins Manual assisted STM with Addasy to right gluteals and lumbar paraspinals for improved muscle elongation and decreased pain Seated piriformis stretch 2 x 30 sec bilateral     01/14/2024 NuStep Level 3 6 mins -PT present to discuss status Towel scrunches seated 8x Seated arch lifts 8x Seated yellow plantarflexion/inversion 15x  Seated ball squeeze between heels with heel raise 15x Standing skiers 10sec hold 6x (like  leaning forward in snow boots) Standing arch lifts 10x sec hold 5x Y touches with WB on left keeping neutral foot 10x Manual therapy: gentle soft tissue mobilization performed with LE elevated to gastrocs, soleus and anterior tib; avoided posterior tib secondary to high sensitivity in this area  01/06/2024 NuStep Level 3 7 mins -PT present to discuss status (towel roll lumbar) Patient education on affected soft tissue around the medial malleolus Seated short foot off with pen under the arch (external cue) (Added to HEP- see below) Towel scrunches 10x Seated ball squeeze between heels with heel raise 15x Ankle inversion yellow band more of an isometric 5 sec holds 8x Gastroc runner's stretch with 30 sec holds (verbal cues for straight foot positioning) right/left Toe/plantar fascial stretch on wall 30 sec holds right/left (Added to HEP- see below)    12/30/2023 NuStep Level 3 7 mins -PT present to discuss status Seated ankle pumps x 10 on Lt  Seated ankle alphabet x 1 on Lt  Baps board clockwise and counterclockwise 10x each direction  Short foot scrunch on towel 12x  Marble pick up 12 marbles x 1 Seated calf raise 2x8  Ankle Inversion and Eversion with yellow TB 2x10 each foot Manual: Addaday to Rt lumbar paraspinals and glutes for improve muscle elongation and decrease pain Manual: Applied kinesiology tape to Lt ankle to improve swelling    12/28/2023 Initial Evaluation & HEP created  Education on getting a shoe lift on right shoe  PATIENT EDUCATION:  Education details: PT eval findings, anticipated POC, progress with PT, and initial HEP Person educated: Patient Education method: Explanation, Demonstration, and Handouts Education comprehension: verbalized understanding, returned demonstration, and needs further education  HOME EXERCISE PROGRAM: Access Code: [email or web address] URL: [email or web address] Date: 01/06/2024 Prepared by: Glade Pesa  Exercises - Supine  Ankle Pumps in Elevation on Pillows  - 1-2 x daily - 7 x weekly - 2 sets - 10 reps - Seated Ankle Alphabet  - 1-2 x daily - 7 x weekly - 1 sets - Long Sitting Calf Stretch with Strap  - 1-2 x daily - 7 x weekly - 2 sets - 20-30s hold - Seated Ankle Inversion with Resistance and Legs Crossed  - 1 x daily - 7 x weekly - 2 sets - 10 reps - Long Sitting Ankle Eversion with Resistance  - 1 x daily - 7 x weekly - 2 sets - 10 reps - Ankle Inversion with Resistance  - 1 x daily - 7 x weekly - 2 sets - 10 reps - Gastroc Stretch with Foot at Wall  - 1 x daily - 7 x weekly - 1 sets - 2-3 reps - 30  hold - Seated Arch Lifts  - 1 x daily - 7 x weekly - 1 sets - 10 reps  ASSESSMENT:  CLINICAL IMPRESSION: Amiree verbalized feeling good after last treatment session. Today she verbalized that her back pain was a little worse than her left ankle pain. She responded well to manual assisted techniques using addaday to target lumbar paraspinals & gluteals. Educated patient in stretches she can incorporate in HEP to target these areas. Progressed resistance for strengthening exercises and patient tolerated them well. She has a follow up appointment with her provider on August 4th. At end of session patient verbalized feeling pain relief in her back and ankle. Patient will benefit from skilled PT to address the below impairments and improve overall function.       OBJECTIVE IMPAIRMENTS: Abnormal gait, decreased activity tolerance, decreased balance, difficulty walking, decreased ROM, decreased strength, hypomobility, increased edema, impaired perceived functional ability, increased muscle spasms, impaired flexibility, and pain.   ACTIVITY LIMITATIONS: standing, stairs, transfers, and locomotion level  PARTICIPATION LIMITATIONS: meal prep, cleaning, interpersonal relationship, shopping, community activity, and yard work  PERSONAL FACTORS: Age and Time since onset of injury/illness/exacerbation are also affecting  patient's functional outcome.   REHAB POTENTIAL: Good  CLINICAL DECISION MAKING: Evolving/moderate complexity  EVALUATION COMPLEXITY: Moderate   GOALS: Goals reviewed with patient? Yes  SHORT TERM GOALS: Target date: 01/25/2024  Patient will be independent with initial HEP. Baseline:  Goal status: INITIAL  2.  Patient will report > or = to 30%  improvement in left ankle pain since starting PT. Baseline:  Goal status: INITIAL   3.  Patient will be able to ascend/ descend a curb with LRAD for improved community mobility. Baseline:  Goal status: INITIAL   LONG TERM GOALS: Target date: 02/22/2024  Patient will demonstrate independence in advanced HEP. Baseline:  Goal status: INITIAL  2.  Patient will report > or = to 60% improvement in left ankle pain since starting PT. Baseline:  Goal status: INITIAL  3.  Patient will verbalize and demonstrate self-care strategies to manage pain including tissue mobility practices and change of position. Baseline:  Goal status: INITIAL  4.  Patient will be able to walk > or = to 10,000 steps per day with < or = to 2/10 left ankle discomfort. Baseline:  Goal status: INITIAL  5.  Patient will score > or = to 63/80 on LEFS due to improved ability to perform functional activities.  Baseline: 54/80 Goal status: INITIAL   PLAN:  PT FREQUENCY: 2x/week  PT DURATION: 8 weeks  PLANNED INTERVENTIONS: 97164- PT Re-evaluation, 97110-Therapeutic exercises, 97530- Therapeutic activity, 97112- Neuromuscular re-education, 97535- Self Care, [id number]- Manual therapy, [phone]- Gait training, [phone]- Canalith repositioning, [phone]- Aquatic Therapy, [phone]- Electrical stimulation (unattended), [phone]- Electrical stimulation (manual), [id number]- Vasopneumatic device, [id number]- Ultrasound, [id number]- Traction (mechanical), [id number]- Ionotophoresis 4mg /ml Dexamethasone , [id number] (1-2 muscles), [id number] (3+ muscles)- Dry Needling, Patient/Family education, Balance training, Stair  training, Taping, Joint mobilization, Joint manipulation, Spinal manipulation, Spinal mobilization, Compression bandaging, Vestibular training, Moist heat, and Biofeedback  PLAN FOR NEXT SESSION:  assess response to treatment session; add side stepping with yellow TB around foot to HEP; gentle strengthening including foot intrinsics   Kristeen Sar, PT 01/18/24 12:10 PM [facility] [address] [place], [place] [address] Phone # [phone] Fax [phone]

## 2024-01-24 ENCOUNTER — Ambulatory Visit (INDEPENDENT_AMBULATORY_CARE_PROVIDER_SITE_OTHER): Admitting: Podiatry

## 2024-01-24 ENCOUNTER — Ambulatory Visit (INDEPENDENT_AMBULATORY_CARE_PROVIDER_SITE_OTHER)

## 2024-01-24 DIAGNOSIS — M76822 Posterior tibial tendinitis, left leg: Secondary | ICD-10-CM

## 2024-01-24 NOTE — Progress Notes (Unsigned)
  Subjective:  Patient ID: Carol Lowery, female    DOB: 02/11/1941,  MRN: [id number]  Chief Complaint  Patient presents with   Foot Pain    Patient is here for ankle pain last seen 12/21/2023- HX of tendonitis has been working with PT so feeling a little better     History of Present Illness Carol Lowery is an 83 year old female with posterior tibial tendon issues who presents with severe left ankle pain and swelling.  She states that she is doing better and physical therapy is helping.  She wore the boot for about 3 days but she states it threw her back out so she discontinued using this.  No other injuries or changes.   Objective:    Physical Exam General: AAO x3, NAD  Dermatological: Skin is warm, dry and supple bilateral.  There are no open sores, no preulcerative lesions, no rash or signs of infection present.  Vascular: Dorsalis Pedis artery and Posterior Tibial artery pedal pulses are 2/4 bilateral with immedate capillary fill time.  There is no pain with calf compression, swelling, warmth, erythema.   Neruologic: Grossly intact via light touch bilateral.   Musculoskeletal: Decreased medial arch upon weightbearing.  There is tenderness palpation of the course of the posterior tibial tendon, just posterior to the medial malleolus.  There is very minimal edema there is no erythema.  Tendon appears to be intact.  No pain Achilles tendon no other areas of discomfort.  No area pinpoint tenderness.      Assessment:   Posterior tibial tendon dysfunction, marrow edema medial malleolus  Plan:  Patient was evaluated and treated and all questions answered.  Assessment and Plan Assessment & Plan Posterior tibial tendonitis, marrow edema medial malleolus -Repeat x-rays obtained reviewed.  No evidence of acute fracture noted today.  Joint spaces maintained. - Again reviewed the MRI.  Symptoms have not but not completely resolved.  Would continue physical therapy.  Discussed possibly  adding shockwave treatment if needed.  Continue shoes, good arch support.  Return in about 6 weeks (around 03/06/2024).  Donnice JONELLE Fees DPM

## 2024-01-25 ENCOUNTER — Ambulatory Visit: Attending: Podiatry | Admitting: Physical Therapy

## 2024-01-25 DIAGNOSIS — R2689 Other abnormalities of gait and mobility: Secondary | ICD-10-CM | POA: Insufficient documentation

## 2024-01-25 DIAGNOSIS — M25572 Pain in left ankle and joints of left foot: Secondary | ICD-10-CM | POA: Diagnosis present

## 2024-01-25 DIAGNOSIS — R600 Localized edema: Secondary | ICD-10-CM | POA: Insufficient documentation

## 2024-01-25 DIAGNOSIS — M6281 Muscle weakness (generalized): Secondary | ICD-10-CM | POA: Diagnosis present

## 2024-01-25 NOTE — Therapy (Signed)
 OUTPATIENT PHYSICAL THERAPY LOWER EXTREMITY TREATMENT   Patient Name: Carol Lowery MRN: [id number] DOB:1940-09-24, 83 y.o., female Today's Date: 01/25/2024  END OF SESSION:  PT End of Session - 01/25/24 1531     Visit Number 6    Date for PT Re-Evaluation 02/22/24    Authorization Type Medicare/[employer]etna Supplement    Progress Note Due on Visit 10    PT Start Time 1531    PT Stop Time 1614    PT Time Calculation (min) 43 min    Activity Tolerance Patient tolerated treatment well            Past Medical History:  Diagnosis Date   Anemia    Arthritis    generalized   Asthma    cough varient asthma;seasonal   Complication of anesthesia    slow to awaken   GERD (gastroesophageal reflux disease)    Head concussion 2018   causes decreased hearing   Headache    Heart murmur    as a child and while pregnant   Hiatal hernia    History of blood transfusion 1970   6 pints after childbirth   Hypothyroidism    Patent foramen ovale    TEE 08/14/[facility]): LVEF 55-60%, no RWMA, PFO with minimal right to left shunt, no ASD, mild MR, small fibrinous pericardial effsuion   Renal cyst    per patient benign   Thyroid disease    Past Surgical History:  Procedure Laterality Date   ABDOMINAL HYSTERECTOMY     APPENDECTOMY     appendectomy of left toe     COLONOSCOPY     DIAGNOSTIC LAPAROSCOPY     bilateral knees   ESOPHAGOGASTRODUODENOSCOPY N/A 12/10/2021   Procedure: ESOPHAGOGASTRODUODENOSCOPY (EGD);  Surgeon: Shyrl Linnie KIDD, MD;  Location: [facility] OR;  Service: Thoracic;  Laterality: N/A;   ESOPHAGOGASTRODUODENOSCOPY N/A 04/19/2023   Procedure: ESOPHAGOGASTRODUODENOSCOPY (EGD) WITH SAVORY DILATION;  Surgeon: Shyrl Linnie KIDD, MD;  Location: [facility] OR;  Service: Thoracic;  Laterality: N/A;   EYE SURGERY     cataract removed bilaterally   JOINT REPLACEMENT     knee replacement left Left    RADICAL VAGINAL HYSTERECTOMY     right carpal tunnel release      TONSILLECTOMY     tonsils removed     UPPER GASTROINTESTINAL ENDOSCOPY     XI ROBOTIC ASSISTED PARAESOPHAGEAL HERNIA REPAIR N/A 12/10/2021   Procedure: XI ROBOTIC ASSISTED PARAESOPHAGEAL HERNIA REPAIR WITH FUNDOPLICATION;  Surgeon: Shyrl Linnie KIDD, MD;  Location: [facility] OR;  Service: Thoracic;  Laterality: N/A;   Patient Active Problem List   Diagnosis Date Noted   S/P repair of paraesophageal hernia 12/10/2021   Acquired hypothyroidism 09/12/2021   Congenital heart disease 09/12/2021   Decreased estrogen level 09/12/2021   Hiatal hernia 09/12/2021   Iron deficiency anemia 09/12/2021   Pure hypercholesterolemia 09/12/2021   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  Last night I was  in so much pain.  I'm so frustrated.  Before I thought I was about 50% better. Now I'm back where I started.  So much pain and swelling last night I couldn't sleep.  I can't think what it was that made it so painful last night.    Goes by [name]    From Eval:Patient presents with left ankle pain that began April 2025, but it is a chronic problem. 40 [place]. Nothing was broken but it bruised really bad. She just has occasional tendonitis flare ups.  Dr. Gershon gave her a boot to wear a week ago but it really flared up her back pain so she went back to the supportive ankle brace. She likes this better. She  elevates her ankle when she sits and ices it every two hours for 20 mins. This does help decrease pain. She is still able to keep her steps up (her goal is 10,000 per day), but with increased activity the pain is worse.  PERTINENT HISTORY: OA; Hypothyroidism; Hx Left knee replacement; years ago left toe fractures when dropped a vacumn cleaner attachment on them  PAIN:  Are you having pain? Yes: NPRS scale: 7-8/10 Pain location: Lt Medial malleoli and feels tight top of the ankle  Pain description: sharp, shooting Aggravating factors: Touching the area; running multiple errands, going up a curb Relieving factors: ice; elevation Advil when the pain is really bad   PRECAUTIONS: None  RED FLAGS: None   WEIGHT BEARING RESTRICTIONS: No  FALLS:  Has patient fallen in last 6 months? No  LIVING ENVIRONMENT: Lives with: lives with their spouse Lives in: House/apartment Stairs: yes stairs but her room is on the main level; stairs to come into the house Has following equipment at home: Grab bars  OCCUPATION: Retired; worked for a church  PLOF: Independent, Independent with basic ADLs, Independent with household mobility without device, Independent with community mobility without device, Independent with gait, Independent with transfers, and Leisure: Travel; hang out with family  PATIENT GOALS: To improve ROM, strength & function   NEXT MD VISIT: 3 weeks  OBJECTIVE:  Note: Objective measures were completed at Evaluation unless otherwise noted.  DIAGNOSTIC FINDINGS:  Ankle MRI 6/19 Impression: 1. Infiltration of the sinus Tarsi and mechanical stress marrow edema around the subtalar joint/sinus Tarsi 2. Proximal plantar aponeurosis degeneration and small plantar calcaneal spur 3. Thickened intermediate signal anterior talofibular and calcaneofibular ligaments 4. Flexor and peroneus longus/brevis tenosynovitis and lesser extensor tenosynovitis 5. Mild edema of the distal calf muscles  possibly use or nerve related 6. Marrow edema at the medial malleolar margin and subcutaneous edema at the inner ankle 7. Hindfoot valgus deformity and suggestion of mild abutment between the lateral malleolus and posterior outer calcaneus  PATIENT SURVEYS:  LEFS: 54/80 67.5%   COGNITION: Overall cognitive status: Within functional limits for tasks assessed     SENSATION: WFL  EDEMA:  Increased edema Lt medial malleoli    POSTURE: rounded shoulders  PALPATION: Tenderness with palpation around medial malleoli Tenderness with palpation tibialis anterior & calf   LOWER EXTREMITY MNF:EMNF WFL; more discomfort at end range movements    LOWER EXTREMITY MMT: MMT limited due to pain with resistance. Inversion more painful than eversion. Grossly 4-/5   FUNCTIONAL TESTS:  5 times sit to stand: 10.79 sec no UE support (no brace) Timed up and go (TUG): 10.30 sec (no brace; pain)  GAIT: Comments: Antalgic gait; decreased stance on  Lt LE                                                                                                                                 TREATMENT DATE: 01/25/2024 NuStep Level 3 10 mins towel roll for lumbar support -PT present to discuss status Discussed contrast bath concept  Discussion of using of her husband's electrotherapy pad he uses for neuropathy lowest setting, shorter duration  Isometric plantarflexion with red band 5x 30 sec (felt better with shoe off) Isometric inversion with ball 5x 30 sec Isometric dorsiflexion against her other foot 5x 30 sec Isometric eversion against her other foot 5x 30 sec Standing short foot arch lifts 10x; discussed progressing to holding the lift 5-10 sec  Kinesiotape: One long strip anchored to dorsal foot wrapped under plantar aspect and up the medial side of her leg for pain and edema control    01/18/2024 NuStep Level 5 6 mins -PT present to discuss status Seated ankle inversion/ eversion with red TB 2 x 10  each Seated arch lifts x10 Standing arch lifts x 10 Side stepping with yellow TB around arches x 2 laps in // bars Standing heel raise on airex 2 x 10 Seated ball squeeze between heels with heel raise 2x10 Seated ankle PF/DF with rocker board x 2 mins Manual assisted STM with Addasy to right gluteals and lumbar paraspinals for improved muscle elongation and decreased pain Seated piriformis stretch 2 x 30 sec bilateral     01/14/2024 NuStep Level 3 6 mins -PT present to discuss status Towel scrunches seated 8x Seated arch lifts 8x Seated yellow plantarflexion/inversion 15x  Seated ball squeeze between heels with heel raise 15x Standing skiers 10sec hold 6x (like leaning forward in snow boots) Standing arch lifts 10x sec hold 5x Y touches with WB on left keeping neutral foot 10x Manual therapy: gentle soft tissue mobilization performed with LE elevated to gastrocs, soleus and anterior tib; avoided posterior tib secondary to high sensitivity in this area  01/06/2024 NuStep Level 3 7 mins -PT present to discuss status (towel roll lumbar) Patient education on affected soft tissue around the medial malleolus Seated short foot off with pen under the arch (external cue) (Added to HEP- see below) Towel scrunches 10x Seated ball squeeze between heels with heel raise 15x Ankle inversion yellow band more of an isometric 5 sec holds 8x Gastroc runner's stretch with 30 sec holds (verbal cues for straight foot positioning) right/left Toe/plantar fascial stretch on wall 30 sec holds right/left (Added to HEP- see below)     PATIENT EDUCATION:  Education details: PT eval findings, anticipated POC, progress with PT, and initial HEP Person educated: Patient Education method: Explanation, Demonstration, and Handouts Education comprehension: verbalized understanding, returned demonstration, and needs further education  HOME EXERCISE PROGRAM: Access Code: [email or web address] URL:  [email or web address] Date: 01/06/2024 Prepared by: Glade Pesa  Exercises - Supine Ankle Pumps in Elevation on Pillows  - 1-2  x daily - 7 x weekly - 2 sets - 10 reps - Seated Ankle Alphabet  - 1-2 x daily - 7 x weekly - 1 sets - Long Sitting Calf Stretch with Strap  - 1-2 x daily - 7 x weekly - 2 sets - 20-30s hold - Seated Ankle Inversion with Resistance and Legs Crossed  - 1 x daily - 7 x weekly - 2 sets - 10 reps - Long Sitting Ankle Eversion with Resistance  - 1 x daily - 7 x weekly - 2 sets - 10 reps - Ankle Inversion with Resistance  - 1 x daily - 7 x weekly - 2 sets - 10 reps - Gastroc Stretch with Foot at Wall  - 1 x daily - 7 x weekly - 1 sets - 2-3 reps - 30 hold - Seated Arch Lifts  - 1 x daily - 7 x weekly - 1 sets - 10 reps  ASSESSMENT:  CLINICAL IMPRESSION: Treatment modified secondary to exacerbation of pain after her visit to the podiatrist. Modifications included isometric type ex's with long duration holds and fewer repetitions.  She reports some pain but rates her pain level < 5/10.  Trial of Kinesiotape for edema and pain control.  STGS not met secondary to flare up will recheck next week.      OBJECTIVE IMPAIRMENTS: Abnormal gait, decreased activity tolerance, decreased balance, difficulty walking, decreased ROM, decreased strength, hypomobility, increased edema, impaired perceived functional ability, increased muscle spasms, impaired flexibility, and pain.   ACTIVITY LIMITATIONS: standing, stairs, transfers, and locomotion level  PARTICIPATION LIMITATIONS: meal prep, cleaning, interpersonal relationship, shopping, community activity, and yard work  PERSONAL FACTORS: Age and Time since onset of injury/illness/exacerbation are also affecting patient's functional outcome.   REHAB POTENTIAL: Good  CLINICAL DECISION MAKING: Evolving/moderate complexity  EVALUATION COMPLEXITY: Moderate   GOALS: Goals reviewed with patient? Yes  SHORT TERM  GOALS: Target date: 01/25/2024  Patient will be independent with initial HEP. Baseline:  Goal status: INITIAL  2.  Patient will report > or = to 30%  improvement in left ankle pain since starting PT. Baseline:  Goal status: INITIAL   3.  Patient will be able to ascend/ descend a curb with LRAD for improved community mobility. Baseline:  Goal status: INITIAL   LONG TERM GOALS: Target date: 02/22/2024  Patient will demonstrate independence in advanced HEP. Baseline:  Goal status: INITIAL  2.  Patient will report > or = to 60% improvement in left ankle pain since starting PT. Baseline:  Goal status: INITIAL  3.  Patient will verbalize and demonstrate self-care strategies to manage pain including tissue mobility practices and change of position. Baseline:  Goal status: INITIAL  4.  Patient will be able to walk > or = to 10,000 steps per day with < or = to 2/10 left ankle discomfort. Baseline:  Goal status: INITIAL  5.  Patient will score > or = to 63/80 on LEFS due to improved ability to perform functional activities.  Baseline: 54/80 Goal status: INITIAL   PLAN:  PT FREQUENCY: 2x/week  PT DURATION: 8 weeks  PLANNED INTERVENTIONS: [id number]- PT Re-evaluation, 97110-Therapeutic exercises, 97530- Therapeutic activity, [id number]- Neuromuscular re-education, 97535- Self Care, [id number]- Manual therapy, [id number]- Gait training, [phone]- Canalith repositioning, [id number]- Aquatic Therapy, [phone]- Electrical stimulation (unattended), [phone]- Electrical stimulation (manual), [id number]- Vasopneumatic device, [id number]- Ultrasound, [id number]- Traction (mechanical), [id number]- Ionotophoresis 4mg /ml Dexamethasone , [id number] (1-2 muscles), [id number] (3+ muscles)- Dry Needling, Patient/Family education, Balance training, Stair training, Taping, Joint mobilization, Joint manipulation, Spinal  manipulation, Spinal mobilization, Compression bandaging, Vestibular training, Moist heat, and Biofeedback  PLAN FOR NEXT SESSION:  check STGs;  assess response to treatment session; add side stepping with yellow TB around foot to HEP; gentle strengthening including foot intrinsics   Glade Pesa, PT 01/25/24 5:33 PM Phone: [phone] Fax: [phone]  [facility] [address] [place], [place] [address] Phone # [phone] Fax [phone]

## 2024-01-27 ENCOUNTER — Ambulatory Visit: Admitting: Physical Therapy

## 2024-01-27 DIAGNOSIS — R2689 Other abnormalities of gait and mobility: Secondary | ICD-10-CM

## 2024-01-27 DIAGNOSIS — R600 Localized edema: Secondary | ICD-10-CM

## 2024-01-27 DIAGNOSIS — M25572 Pain in left ankle and joints of left foot: Secondary | ICD-10-CM | POA: Diagnosis not present

## 2024-01-27 DIAGNOSIS — M6281 Muscle weakness (generalized): Secondary | ICD-10-CM

## 2024-01-27 NOTE — Therapy (Signed)
 OUTPATIENT PHYSICAL THERAPY LOWER EXTREMITY TREATMENT   Patient Name: Carol Lowery MRN: [id number] DOB:1940-11-12, 83 y.o., female Today's Date: 01/27/2024  END OF SESSION:  PT End of Session - 01/27/24 1449     Visit Number 7    Date for PT Re-Evaluation 02/22/24    Authorization Type Medicare/[employer]na Supplement    Progress Note Due on Visit 10    PT Start Time 1445    PT Stop Time 1525    PT Time Calculation (min) 40 min    Activity Tolerance Patient tolerated treatment well            Past Medical History:  Diagnosis Date   Anemia    Arthritis    generalized   Asthma    cough varient asthma;seasonal   Complication of anesthesia    slow to awaken   GERD (gastroesophageal reflux disease)    Head concussion 2018   causes decreased hearing   Headache    Heart murmur    as a child and while pregnant   Hiatal hernia    History of blood transfusion 1970   6 pints after childbirth   Hypothyroidism    Patent foramen ovale    TEE 08/14/15 [facility]): LVEF 55-60%, no RWMA, PFO with minimal right to left shunt, no ASD, mild MR, small fibrinous pericardial effsuion   Renal cyst    per patient benign   Thyroid disease    Past Surgical History:  Procedure Laterality Date   ABDOMINAL HYSTERECTOMY     APPENDECTOMY     appendectomy of left toe     COLONOSCOPY     DIAGNOSTIC LAPAROSCOPY     bilateral knees   ESOPHAGOGASTRODUODENOSCOPY N/A 12/10/2021   Procedure: ESOPHAGOGASTRODUODENOSCOPY (EGD);  Surgeon: Shyrl Linnie KIDD, MD;  Location: [facility] OR;  Service: Thoracic;  Laterality: N/A;   ESOPHAGOGASTRODUODENOSCOPY N/A 04/19/2023   Procedure: ESOPHAGOGASTRODUODENOSCOPY (EGD) WITH SAVORY DILATION;  Surgeon: Shyrl Linnie KIDD, MD;  Location: [facility] OR;  Service: Thoracic;  Laterality: N/A;   EYE SURGERY     cataract removed bilaterally   JOINT REPLACEMENT     knee replacement left Left    RADICAL VAGINAL HYSTERECTOMY     right carpal tunnel release      TONSILLECTOMY     tonsils removed     UPPER GASTROINTESTINAL ENDOSCOPY     XI ROBOTIC ASSISTED PARAESOPHAGEAL HERNIA REPAIR N/A 12/10/2021   Procedure: XI ROBOTIC ASSISTED PARAESOPHAGEAL HERNIA REPAIR WITH FUNDOPLICATION;  Surgeon: Shyrl Linnie KIDD, MD;  Location: [facility] OR;  Service: Thoracic;  Laterality: N/A;   Patient Active Problem List   Diagnosis Date Noted   S/P repair of paraesophageal hernia 12/10/2021   Acquired hypothyroidism 09/12/2021   Congenital heart disease 09/12/2021   Decreased estrogen level 09/12/2021   Hiatal hernia 09/12/2021   Iron deficiency anemia 09/12/2021   Pure hypercholesterolemia 09/12/2021   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  Some of the tape came off but the  rest is still on.  Wearing the brace b/c I've been running errands all day.  Family is coming to town the week of 8/18-8/22 so I need to cancel those appts.  Goes by Carol Lowery    From Eval:Patient presents with left ankle pain that began April 2025, but it is a chronic problem. 40 years ago [place]. Nothing was broken but it bruised really bad. She just has occasional tendonitis flare ups.  Dr. Gershon gave her a boot to wear a week ago but it really flared up her back pain so she went back to the supportive ankle brace. She likes this better. She elevates her ankle when she sits and ices it every two hours for 20 mins. This does  help decrease pain. She is still able to keep her steps up (her goal is 10,000 per day), but with increased activity the pain is worse.  PERTINENT HISTORY: OA; Hypothyroidism; Hx Left knee replacement; years ago left toe fractures when dropped a vacumn cleaner attachment on them  PAIN:  Are you having pain? Yes: NPRS scale: 6/10 Pain location: Lt Medial malleoli and feels tight top of the ankle  Pain description: sharp, shooting Aggravating factors: Touching the area; running multiple errands, going up a curb Relieving factors: ice; elevation Advil when the pain is really bad   PRECAUTIONS: None  RED FLAGS: None   WEIGHT BEARING RESTRICTIONS: No  FALLS:  Has patient fallen in last 6 months? No  LIVING ENVIRONMENT: Lives with: lives with their spouse Lives in: House/apartment Stairs: yes stairs but her room is on the main level; stairs to come into the house Has following equipment at home: Grab bars  OCCUPATION: Retired; worked for a church  PLOF: Independent, Independent with basic ADLs, Independent with household mobility without device, Independent with community mobility without device, Independent with gait, Independent with transfers, and Leisure: Travel; hang out with family  PATIENT GOALS: To improve ROM, strength & function   NEXT MD VISIT: 3 weeks  OBJECTIVE:  Note: Objective measures were completed at Evaluation unless otherwise noted.  DIAGNOSTIC FINDINGS:  Ankle MRI 6/19 Impression: 1. Infiltration of the sinus Tarsi and mechanical stress marrow edema around the subtalar joint/sinus Tarsi 2. Proximal plantar aponeurosis degeneration and small plantar calcaneal spur 3. Thickened intermediate signal anterior talofibular and calcaneofibular ligaments 4. Flexor and peroneus longus/brevis tenosynovitis and lesser extensor tenosynovitis 5. Mild edema of the distal calf muscles possibly use or nerve related 6. Marrow edema at the medial malleolar margin and  subcutaneous edema at the inner ankle 7. Hindfoot valgus deformity and suggestion of mild abutment between the lateral malleolus and posterior outer calcaneus  PATIENT SURVEYS:  LEFS: 54/80 67.5%   COGNITION: Overall cognitive status: Within functional limits for tasks assessed     SENSATION: WFL  EDEMA:  Increased edema Lt medial malleoli    POSTURE: rounded shoulders  PALPATION: Tenderness with palpation around medial malleoli Tenderness with palpation tibialis anterior & calf   LOWER EXTREMITY MNF:EMNF WFL; more discomfort at end range movements    LOWER EXTREMITY MMT: MMT limited due to pain with resistance. Inversion more painful than eversion. Grossly 4-/5   FUNCTIONAL TESTS:  5 times sit to stand: 10.79 sec no UE support (no brace) Timed up and go (TUG): 10.30 sec (no brace; pain)  GAIT: Comments: Antalgic gait; decreased stance on Lt LE  TREATMENT DATE: 01/27/2024 NuStep Level 3 10 mins towel roll for lumbar support -PT present to discuss status KT tape intact on medial side of ankle/leg, instructed to remove in the next 24-36 hours to assess skin tolerance Isometric plantarflexion with green band 3x 45 sec  Seated ball squeeze with heel raises 10x 2 Standing: left short foot holding 5 sec while moving right  right foot forward and back and side to side 5x 2 sets  Isometric inversion with ball 5x 30 sec Isometric dorsiflexion against her other foot 5x 30 sec Isometric eversion against her other foot 5x 30 sec  01/25/2024 NuStep Level 3 10 mins towel roll for lumbar support -PT present to discuss status Discussed contrast bath concept  Discussion of using of her husband's electrotherapy pad he uses for neuropathy lowest setting, shorter duration  Isometric plantarflexion with red band 5x 30 sec (felt better with shoe off) Isometric  inversion with ball 5x 30 sec Isometric dorsiflexion against her other foot 5x 30 sec Isometric eversion against her other foot 5x 30 sec Standing short foot arch lifts 10x; discussed progressing to holding the lift 5-10 sec  Kinesiotape: One long strip anchored to dorsal foot wrapped under plantar aspect and up the medial side of her leg for pain and edema control    01/18/2024 NuStep Level 5 6 mins -PT present to discuss status Seated ankle inversion/ eversion with red TB 2 x 10 each Seated arch lifts x10 Standing arch lifts x 10 Side stepping with yellow TB around arches x 2 laps in // bars Standing heel raise on airex 2 x 10 Seated ball squeeze between heels with heel raise 2x10 Seated ankle PF/DF with rocker board x 2 mins Manual assisted STM with Addasy to right gluteals and lumbar paraspinals for improved muscle elongation and decreased pain Seated piriformis stretch 2 x 30 sec bilateral     01/14/2024 NuStep Level 3 6 mins -PT present to discuss status Towel scrunches seated 8x Seated arch lifts 8x Seated yellow plantarflexion/inversion 15x  Seated ball squeeze between heels with heel raise 15x Standing skiers 10sec hold 6x (like leaning forward in snow boots) Standing arch lifts 10x sec hold 5x Y touches with WB on left keeping neutral foot 10x Manual therapy: gentle soft tissue mobilization performed with LE elevated to gastrocs, soleus and anterior tib; avoided posterior tib secondary to high sensitivity in this area    PATIENT EDUCATION:  Education details: PT eval findings, anticipated POC, progress with PT, and initial HEP Person educated: Patient Education method: Explanation, Demonstration, and Handouts Education comprehension: verbalized understanding, returned demonstration, and needs further education  HOME EXERCISE PROGRAM: Access Code: [email or web address] URL: [email or web address] Date: 01/06/2024 Prepared by: Glade Pesa  Exercises - Supine  Ankle Pumps in Elevation on Pillows  - 1-2 x daily - 7 x weekly - 2 sets - 10 reps - Seated Ankle Alphabet  - 1-2 x daily - 7 x weekly - 1 sets - Long Sitting Calf Stretch with Strap  - 1-2 x daily - 7 x weekly - 2 sets - 20-30s hold - Seated Ankle Inversion with Resistance and Legs Crossed  - 1 x daily - 7 x weekly - 2 sets - 10 reps - Long Sitting Ankle Eversion with Resistance  - 1 x daily - 7 x weekly - 2 sets - 10 reps - Ankle Inversion with Resistance  - 1 x daily - 7 x weekly - 2 sets - 10 reps - Gastroc Stretch with Foot  at Wall  - 1 x daily - 7 x weekly - 1 sets - 2-3 reps - 30 hold - Seated Arch Lifts  - 1 x daily - 7 x weekly - 1 sets - 10 reps  ASSESSMENT:  CLINICAL IMPRESSION: Decreased pain level compared to last visit.  Mild to moderate medial ankle edema upon brace removal.  Treatment focus on strengthening of the ankle and foot muscles as well as neuromuscular re-ed on activating foot intrinsics needed to hold foot in the neutral position in weight bearing.  With fatigue and at rest her foot overpronates putting extra load of medial soft tissues.   Therapist providing cues, modifications and monitoring form during exercises.  Pain STG not met secondary to flare up after her visit to the podiatrist this week.       OBJECTIVE IMPAIRMENTS: Abnormal gait, decreased activity tolerance, decreased balance, difficulty walking, decreased ROM, decreased strength, hypomobility, increased edema, impaired perceived functional ability, increased muscle spasms, impaired flexibility, and pain.   ACTIVITY LIMITATIONS: standing, stairs, transfers, and locomotion level  PARTICIPATION LIMITATIONS: meal prep, cleaning, interpersonal relationship, shopping, community activity, and yard work  PERSONAL FACTORS: Age and Time since onset of injury/illness/exacerbation are also affecting patient's functional outcome.   REHAB POTENTIAL: Good  CLINICAL DECISION MAKING: Evolving/moderate  complexity  EVALUATION COMPLEXITY: Moderate   GOALS: Goals reviewed with patient? Yes  SHORT TERM GOALS: Target date: 01/25/2024  Patient will be independent with initial HEP. Baseline:  Goal status: met 8/7  2.  Patient will report > or = to 30%  improvement in left ankle pain since starting PT. Baseline:  Goal status: INITIAL   3.  Patient will be able to ascend/ descend a curb with LRAD for improved community mobility. Baseline:  Goal status: met 8/7   LONG TERM GOALS: Target date: 02/22/2024  Patient will demonstrate independence in advanced HEP. Baseline:  Goal status: INITIAL  2.  Patient will report > or = to 60% improvement in left ankle pain since starting PT. Baseline:  Goal status: INITIAL  3.  Patient will verbalize and demonstrate self-care strategies to manage pain including tissue mobility practices and change of position. Baseline:  Goal status: INITIAL  4.  Patient will be able to walk > or = to 10,000 steps per day with < or = to 2/10 left ankle discomfort. Baseline:  Goal status: INITIAL  5.  Patient will score > or = to 63/80 on LEFS due to improved ability to perform functional activities.  Baseline: 54/80 Goal status: INITIAL   PLAN:  PT FREQUENCY: 2x/week  PT DURATION: 8 weeks  PLANNED INTERVENTIONS: 97164- PT Re-evaluation, 97110-Therapeutic exercises, 97530- Therapeutic activity, 97112- Neuromuscular re-education, 97535- Self Care, [id number]- Manual therapy, [phone]- Gait training, [phone]- Canalith repositioning, [phone]- Aquatic Therapy, [phone]- Electrical stimulation (unattended), [phone]- Electrical stimulation (manual), [id number]- Vasopneumatic device, [id number]- Ultrasound, [id number]- Traction (mechanical), [id number]- Ionotophoresis 4mg /ml Dexamethasone , [id number] (1-2 muscles), [id number] (3+ muscles)- Dry Needling, Patient/Family education, Balance training, Stair training, Taping, Joint mobilization, Joint manipulation, Spinal manipulation, Spinal mobilization,  Compression bandaging, Vestibular training, Moist heat, and Biofeedback  PLAN FOR NEXT SESSION:  check remaining STGs;  add side stepping with yellow TB around foot to HEP; gentle strengthening including foot intrinsics; pt plans on trying her husband's electrotherapy pad he uses for neuropathy   Glade Pesa, PT 01/27/24 4:49 PM Phone: [phone] Fax: [phone]  [facility] [address] [place], [place] [address] Phone # [phone] [phone]

## 2024-01-31 ENCOUNTER — Encounter: Payer: Self-pay | Admitting: Physical Therapy

## 2024-01-31 ENCOUNTER — Ambulatory Visit: Admitting: Physical Therapy

## 2024-01-31 DIAGNOSIS — R600 Localized edema: Secondary | ICD-10-CM

## 2024-01-31 DIAGNOSIS — M25572 Pain in left ankle and joints of left foot: Secondary | ICD-10-CM

## 2024-01-31 DIAGNOSIS — R2689 Other abnormalities of gait and mobility: Secondary | ICD-10-CM

## 2024-01-31 DIAGNOSIS — M6281 Muscle weakness (generalized): Secondary | ICD-10-CM

## 2024-01-31 NOTE — Therapy (Signed)
 OUTPATIENT PHYSICAL THERAPY LOWER EXTREMITY TREATMENT   Patient Name: Carol Lowery MRN: [id number] DOB:08-26-1940, 83 y.o., female Today's Date: 01/31/2024  END OF SESSION:  PT End of Session - 01/31/24 1454     Visit Number 8    Date for PT Re-Evaluation 02/22/24    Authorization Type Medicare/[employer] Supplement    Progress Note Due on Visit 10    PT Start Time 1400    PT Stop Time 1443    PT Time Calculation (min) 43 min    Activity Tolerance Patient tolerated treatment well    Behavior During Therapy WFL for tasks assessed/performed             Past Medical History:  Diagnosis Date   Anemia    Arthritis    generalized   Asthma    cough varient asthma;seasonal   Complication of anesthesia    slow to awaken   GERD (gastroesophageal reflux disease)    Head concussion 2018   causes decreased hearing   Headache    Heart murmur    as a child and while pregnant   Hiatal hernia    History of blood transfusion 1970   6 pints after childbirth   Hypothyroidism    Patent foramen ovale    TEE 08/14/15 [facility]): LVEF 55-60%, no RWMA, PFO with minimal right to left shunt, no ASD, mild MR, small fibrinous pericardial effsuion   Renal cyst    per patient benign   Thyroid disease    Past Surgical History:  Procedure Laterality Date   ABDOMINAL HYSTERECTOMY     APPENDECTOMY     appendectomy of left toe     COLONOSCOPY     DIAGNOSTIC LAPAROSCOPY     bilateral knees   ESOPHAGOGASTRODUODENOSCOPY N/A 12/10/2021   Procedure: ESOPHAGOGASTRODUODENOSCOPY (EGD);  Surgeon: Shyrl Linnie KIDD, MD;  Location: [facility] OR;  Service: Thoracic;  Laterality: N/A;   ESOPHAGOGASTRODUODENOSCOPY N/A 04/19/2023   Procedure: ESOPHAGOGASTRODUODENOSCOPY (EGD) WITH SAVORY DILATION;  Surgeon: Shyrl Linnie KIDD, MD;  Location: [facility] OR;  Service: Thoracic;  Laterality: N/A;   EYE SURGERY     cataract removed bilaterally   JOINT REPLACEMENT     knee replacement left Left    RADICAL  VAGINAL HYSTERECTOMY     right carpal tunnel release     TONSILLECTOMY     tonsils removed     UPPER GASTROINTESTINAL ENDOSCOPY     XI ROBOTIC ASSISTED PARAESOPHAGEAL HERNIA REPAIR N/A 12/10/2021   Procedure: XI ROBOTIC ASSISTED PARAESOPHAGEAL HERNIA REPAIR WITH FUNDOPLICATION;  Surgeon: Shyrl Linnie KIDD, MD;  Location: [facility] OR;  Service: Thoracic;  Laterality: N/A;   Patient Active Problem List   Diagnosis Date Noted   S/P repair of paraesophageal hernia 12/10/2021   Acquired hypothyroidism 09/12/2021   Congenital heart disease 09/12/2021   Decreased estrogen level 09/12/2021   Hiatal hernia 09/12/2021   Iron deficiency anemia 09/12/2021   Pure hypercholesterolemia 09/12/2021   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 It felt a lot better after KT tape last session. She took it easy on exercises over the weekend. The back is still not great    From Eval:Patient presents with left ankle pain that began April 2025, but it is a chronic problem. 40 years [place]. Nothing was broken but it bruised really bad. She just has occasional tendonitis flare ups.  Dr. Gershon gave her a boot to wear a week ago but it really flared up her back pain so she went back to the supportive ankle brace. She likes this better. She elevates her ankle when she sits and ices it every two hours for 20 mins.  This does help decrease pain. She is still able to keep her steps up (her goal is 10,000 per day), but with increased activity the pain is worse.  PERTINENT HISTORY: OA; Hypothyroidism; Hx Left knee replacement; years ago left toe fractures when dropped a vacumn cleaner attachment on them  PAIN:  Are you having pain? Yes: NPRS scale: 6/10 Pain location: Lt Medial malleoli and feels tight top of the ankle  Pain description: sharp, shooting Aggravating factors: Touching the area; running multiple errands, going up a curb Relieving factors: ice; elevation Advil when the pain is really bad   PRECAUTIONS: None  RED FLAGS: None   WEIGHT BEARING RESTRICTIONS: No  FALLS:  Has patient fallen in last 6 months? No  LIVING ENVIRONMENT: Lives with: lives with their spouse Lives in: House/apartment Stairs: yes stairs but her room is on the main level; stairs to come into the house Has following equipment at home: Grab bars  OCCUPATION: Retired; worked for a church  PLOF: Independent, Independent with basic ADLs, Independent with household mobility without device, Independent with community mobility without device, Independent with gait, Independent with transfers, and Leisure: Travel; hang out with family  PATIENT GOALS: To improve ROM, strength & function   NEXT MD VISIT: 3 weeks  OBJECTIVE:  Note: Objective measures were completed at Evaluation unless otherwise noted.  DIAGNOSTIC FINDINGS:  Ankle MRI 6/19 Impression: 1. Infiltration of the sinus Tarsi and mechanical stress marrow edema around the subtalar joint/sinus Tarsi 2. Proximal plantar aponeurosis degeneration and small plantar calcaneal spur 3. Thickened intermediate signal anterior talofibular and calcaneofibular ligaments 4. Flexor and peroneus longus/brevis tenosynovitis and lesser extensor tenosynovitis 5. Mild edema of the distal calf muscles possibly use or nerve related 6. Marrow edema at the medial malleolar  margin and subcutaneous edema at the inner ankle 7. Hindfoot valgus deformity and suggestion of mild abutment between the lateral malleolus and posterior outer calcaneus  PATIENT SURVEYS:  LEFS: 54/80 67.5%   COGNITION: Overall cognitive status: Within functional limits for tasks assessed     SENSATION: WFL  EDEMA:  Increased edema Lt medial malleoli    POSTURE: rounded shoulders  PALPATION: Tenderness with palpation around medial malleoli Tenderness with palpation tibialis anterior & calf   LOWER EXTREMITY MNF:EMNF WFL; more discomfort at end range movements    LOWER EXTREMITY MMT: MMT limited due to pain with resistance. Inversion more painful than eversion. Grossly 4-/5   FUNCTIONAL TESTS:  5 times sit to stand: 10.79 sec no UE support (no brace) Timed up and go (TUG): 10.30 sec (no brace; pain)  GAIT: Comments: Antalgic gait; decreased stance on Lt LE  TREATMENT DATE: 01/31/2024 NuStep Level 5 8 mins towel roll for lumbar support -PT present to discuss status Isometric plantarflexion with green band 3x 45 sec  Isometric inversion with ball 4 x 30 sec Lt  Isometric eversion against her other foot 4x 30 sec Left short foot in standing x 12 Left short foot standing on airex x 12 Standing on airex +Heel raise x 12 Standing: left short foot holding 5 sec while moving right  right foot forward and back and side to side 2x8 (attempted standing on airex but patient had increased pain. Performed on solid ground) KT tape intact on medial side of ankle/leg, instructed to remove in the next 24-36 hours to assess skin tolerance    01/27/2024 NuStep Level 3 10 mins towel roll for lumbar support -PT present to discuss status KT tape intact on medial side of ankle/leg, instructed to remove in the next 24-36 hours to assess skin tolerance Isometric  plantarflexion with green band 3x 45 sec  Seated ball squeeze with heel raises 10x 2 Standing: left short foot holding 5 sec while moving right  right foot forward and back and side to side 5x 2 sets  Isometric inversion with ball 5x 30 sec Isometric dorsiflexion against her other foot 5x 30 sec Isometric eversion against her other foot 5x 30 sec  01/25/2024 NuStep Level 3 10 mins towel roll for lumbar support -PT present to discuss status Discussed contrast bath concept  Discussion of using of her husband's electrotherapy pad he uses for neuropathy lowest setting, shorter duration  Isometric plantarflexion with red band 5x 30 sec (felt better with shoe off) Isometric inversion with ball 5x 30 sec Isometric dorsiflexion against her other foot 5x 30 sec Isometric eversion against her other foot 5x 30 sec Standing short foot arch lifts 10x; discussed progressing to holding the lift 5-10 sec  Kinesiotape: One long strip anchored to dorsal foot wrapped under plantar aspect and up the medial side of her leg for pain and edema control    PATIENT EDUCATION:  Education details: PT eval findings, anticipated POC, progress with PT, and initial HEP Person educated: Patient Education method: Explanation, Demonstration, and Handouts Education comprehension: verbalized understanding, returned demonstration, and needs further education  HOME EXERCISE PROGRAM: Access Code: [email or web address] URL: [email or web address] Date: 01/06/2024 Prepared by: Glade Pesa  Exercises - Supine Ankle Pumps in Elevation on Pillows  - 1-2 x daily - 7 x weekly - 2 sets - 10 reps - Seated Ankle Alphabet  - 1-2 x daily - 7 x weekly - 1 sets - Long Sitting Calf Stretch with Strap  - 1-2 x daily - 7 x weekly - 2 sets - 20-30s hold - Seated Ankle Inversion with Resistance and Legs Crossed  - 1 x daily - 7 x weekly - 2 sets - 10 reps - Long Sitting Ankle Eversion with Resistance  - 1 x daily - 7 x weekly - 2 sets -  10 reps - Ankle Inversion with Resistance  - 1 x daily - 7 x weekly - 2 sets - 10 reps - Gastroc Stretch with Foot at Wall  - 1 x daily - 7 x weekly - 1 sets - 2-3 reps - 30 hold - Seated Arch Lifts  - 1 x daily - 7 x weekly - 1 sets - 10 reps  ASSESSMENT:  CLINICAL IMPRESSION: Carol Lowery presents with decreased ankle pain after last treatment session. She felt relief after KT tape and she continued lower intensity exercises at  home over the weekend. Continued lower intensity isometrics today since patient is responding to that well. Attempted short foot + leg swing on airex but patient had increased pain left ankle pain. She tolerated heel raises and short foot exercise well standing on airex. Patient will benefit from skilled PT to address the below impairments and improve overall function.       OBJECTIVE IMPAIRMENTS: Abnormal gait, decreased activity tolerance, decreased balance, difficulty walking, decreased ROM, decreased strength, hypomobility, increased edema, impaired perceived functional ability, increased muscle spasms, impaired flexibility, and pain.   ACTIVITY LIMITATIONS: standing, stairs, transfers, and locomotion level  PARTICIPATION LIMITATIONS: meal prep, cleaning, interpersonal relationship, shopping, community activity, and yard work  PERSONAL FACTORS: Age and Time since onset of injury/illness/exacerbation are also affecting patient's functional outcome.   REHAB POTENTIAL: Good  CLINICAL DECISION MAKING: Evolving/moderate complexity  EVALUATION COMPLEXITY: Moderate   GOALS: Goals reviewed with patient? Yes  SHORT TERM GOALS: Target date: 01/25/2024  Patient will be independent with initial HEP. Baseline:  Goal status: met 8/7  2.  Patient will report > or = to 30%  improvement in left ankle pain since starting PT. Baseline:  Goal status: INITIAL   3.  Patient will be able to ascend/ descend a curb with LRAD for improved community mobility. Baseline:  Goal  status: met 8/7   LONG TERM GOALS: Target date: 02/22/2024  Patient will demonstrate independence in advanced HEP. Baseline:  Goal status: INITIAL  2.  Patient will report > or = to 60% improvement in left ankle pain since starting PT. Baseline:  Goal status: INITIAL  3.  Patient will verbalize and demonstrate self-care strategies to manage pain including tissue mobility practices and change of position. Baseline:  Goal status: INITIAL  4.  Patient will be able to walk > or = to 10,000 steps per day with < or = to 2/10 left ankle discomfort. Baseline:  Goal status: INITIAL  5.  Patient will score > or = to 63/80 on LEFS due to improved ability to perform functional activities.  Baseline: 54/80 Goal status: INITIAL   PLAN:  PT FREQUENCY: 2x/week  PT DURATION: 8 weeks  PLANNED INTERVENTIONS: 97164- PT Re-evaluation, 97110-Therapeutic exercises, 97530- Therapeutic activity, 97112- Neuromuscular re-education, 97535- Self Care, [id number]- Manual therapy, [phone]- Gait training, [phone]- Canalith repositioning, [phone]- Aquatic Therapy, [phone]- Electrical stimulation (unattended), [phone]- Electrical stimulation (manual), [id number]- Vasopneumatic device, [id number]- Ultrasound, [id number]- Traction (mechanical), [id number]- Ionotophoresis 4mg /ml Dexamethasone , [id number] (1-2 muscles), [id number] (3+ muscles)- Dry Needling, Patient/Family education, Balance training, Stair training, Taping, Joint mobilization, Joint manipulation, Spinal manipulation, Spinal mobilization, Compression bandaging, Vestibular training, Moist heat, and Biofeedback  PLAN FOR NEXT SESSION: see benefit of taping; STG add side stepping with yellow TB around foot to HEP; gentle strengthening including foot intrinsics  Kristeen Sar, PT 01/31/24 2:56 PM [facility] [address], [address] [place], [place] [address] Phone # [phone] Fax [phone]

## 2024-02-02 ENCOUNTER — Ambulatory Visit: Admitting: Physical Therapy

## 2024-02-02 DIAGNOSIS — M6281 Muscle weakness (generalized): Secondary | ICD-10-CM

## 2024-02-02 DIAGNOSIS — M25572 Pain in left ankle and joints of left foot: Secondary | ICD-10-CM | POA: Diagnosis not present

## 2024-02-02 DIAGNOSIS — R2689 Other abnormalities of gait and mobility: Secondary | ICD-10-CM

## 2024-02-02 DIAGNOSIS — R600 Localized edema: Secondary | ICD-10-CM

## 2024-02-02 NOTE — Therapy (Signed)
 OUTPATIENT PHYSICAL THERAPY LOWER EXTREMITY TREATMENT   Patient Name: Carol Lowery MRN: [id number] DOB:01-02-1941, 83 y.o., female Today's Date: 02/02/2024  END OF SESSION:  PT End of Session - 02/02/24 1405     Visit Number 9    Date for PT Re-Evaluation 02/22/24    Authorization Type Medicare/[employer]etna Supplement    Progress Note Due on Visit 10    PT Start Time 1403    PT Stop Time 1443    PT Time Calculation (min) 40 min    Activity Tolerance Patient tolerated treatment well             Past Medical History:  Diagnosis Date   Anemia    Arthritis    generalized   Asthma    cough varient asthma;seasonal   Complication of anesthesia    slow to awaken   GERD (gastroesophageal reflux disease)    Head concussion 2018   causes decreased hearing   Headache    Heart murmur    as a child and while pregnant   Hiatal hernia    History of blood transfusion 1970   6 pints after childbirth   Hypothyroidism    Patent foramen ovale    TEE 08/14/15 [facility]): LVEF 55-60%, no RWMA, PFO with minimal right to left shunt, no ASD, mild MR, small fibrinous pericardial effsuion   Renal cyst    per patient benign   Thyroid disease    Past Surgical History:  Procedure Laterality Date   ABDOMINAL HYSTERECTOMY     APPENDECTOMY     appendectomy of left toe     COLONOSCOPY     DIAGNOSTIC LAPAROSCOPY     bilateral knees   ESOPHAGOGASTRODUODENOSCOPY N/A 12/10/2021   Procedure: ESOPHAGOGASTRODUODENOSCOPY (EGD);  Surgeon: Shyrl Linnie KIDD, MD;  Location: [facility] OR;  Service: Thoracic;  Laterality: N/A;   ESOPHAGOGASTRODUODENOSCOPY N/A 04/19/2023   Procedure: ESOPHAGOGASTRODUODENOSCOPY (EGD) WITH SAVORY DILATION;  Surgeon: Shyrl Linnie KIDD, MD;  Location: [facility] OR;  Service: Thoracic;  Laterality: N/A;   EYE SURGERY     cataract removed bilaterally   JOINT REPLACEMENT     knee replacement left Left    RADICAL VAGINAL HYSTERECTOMY     right carpal tunnel release      TONSILLECTOMY     tonsils removed     UPPER GASTROINTESTINAL ENDOSCOPY     XI ROBOTIC ASSISTED PARAESOPHAGEAL HERNIA REPAIR N/A 12/10/2021   Procedure: XI ROBOTIC ASSISTED PARAESOPHAGEAL HERNIA REPAIR WITH FUNDOPLICATION;  Surgeon: Shyrl Linnie KIDD, MD;  Location: [facility] OR;  Service: Thoracic;  Laterality: N/A;   Patient Active Problem List   Diagnosis Date Noted   S/P repair of paraesophageal hernia 12/10/2021   Acquired hypothyroidism 09/12/2021   Congenital heart disease 09/12/2021   Decreased estrogen level 09/12/2021   Hiatal hernia 09/12/2021   Iron deficiency anemia 09/12/2021   Pure hypercholesterolemia 09/12/2021   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  Last night I was hurting.  Today not that bad today. The tape helps (she taped on Monday).  The back is really hurting, that's typical for a rainy day.      From Eval:Patient presents with left ankle pain that began April 2025, but it is a chronic problem. 40 [place]. Nothing was broken but it bruised really bad. She just has occasional tendonitis flare ups.  Dr. Gershon gave her a boot to wear a week ago but it really flared up her back pain so she went back to the supportive ankle brace. She likes this better. She elevates her ankle when she sits and ices it every two hours for 20 mins. This does help decrease pain. She is still  able to keep her steps up (her goal is 10,000 per day), but with increased activity the pain is worse.  PERTINENT HISTORY: OA; Hypothyroidism; Hx Left knee replacement; years ago left toe fractures when dropped a vacumn cleaner attachment on them  PAIN:  Are you having pain? Yes: NPRS scale: 5-6/10 Pain location: Lt Medial malleoli and feels tight top of the ankle  Pain description: sharp, shooting Aggravating factors: Touching the area; running multiple errands, going up a curb Relieving factors: ice; elevation Advil when the pain is really bad   PRECAUTIONS: None  RED FLAGS: None   WEIGHT BEARING RESTRICTIONS: No  FALLS:  Has patient fallen in last 6 months? No  LIVING ENVIRONMENT: Lives with: lives with their spouse Lives in: House/apartment Stairs: yes stairs but her room is on the main level; stairs to come into the house Has following equipment at home: Grab bars  OCCUPATION: Retired; worked for a church  PLOF: Independent, Independent with basic ADLs, Independent with household mobility without device, Independent with community mobility without device, Independent with gait, Independent with transfers, and Leisure: Travel; hang out with family  PATIENT GOALS: To improve ROM, strength & function   NEXT MD VISIT: 3 weeks  OBJECTIVE:  Note: Objective measures were completed at Evaluation unless otherwise noted.  DIAGNOSTIC FINDINGS:  Ankle MRI 6/19 Impression: 1. Infiltration of the sinus Tarsi and mechanical stress marrow edema around the subtalar joint/sinus Tarsi 2. Proximal plantar aponeurosis degeneration and small plantar calcaneal spur 3. Thickened intermediate signal anterior talofibular and calcaneofibular ligaments 4. Flexor and peroneus longus/brevis tenosynovitis and lesser extensor tenosynovitis 5. Mild edema of the distal calf muscles possibly use or nerve related 6. Marrow edema at the medial malleolar margin and subcutaneous edema at the inner  ankle 7. Hindfoot valgus deformity and suggestion of mild abutment between the lateral malleolus and posterior outer calcaneus  PATIENT SURVEYS:  LEFS: 54/80 67.5%   COGNITION: Overall cognitive status: Within functional limits for tasks assessed     SENSATION: WFL  EDEMA:  Increased edema Lt medial malleoli    POSTURE: rounded shoulders  PALPATION: Tenderness with palpation around medial malleoli Tenderness with palpation tibialis anterior & calf   LOWER EXTREMITY MNF:EMNF WFL; more discomfort at end range movements    LOWER EXTREMITY MMT: MMT limited due to pain with resistance. Inversion more painful than eversion. Grossly 4-/5   FUNCTIONAL TESTS:  5 times sit to stand: 10.79 sec no UE support (no brace) Timed up and go (TUG): 10.30 sec (no brace; pain)  GAIT: Comments: Antalgic gait; decreased stance on Lt LE  TREATMENT DATE: 02/02/2024 NuStep Level 5 10 mins towel roll for lumbar support -PT present to discuss status Seated 5# KB resting on knee: toes on small step heel raises (deficit heel lift) 20x Staggered stance with right light toe touch with 5# KB hip to hip 2x 10 Single leg modified RDL with left touch on barre, WB on left 10x Gastroc stretch on wall 30 sec x3 WB on left with circles touches with right toes (circle formation) anticipated and random Standing short foot arch lifts 5 sec holds 7 Review of home isometrics Pt given a strip of KT tape for home application since she is unable to come to PT next week   01/31/2024 NuStep Level 5 8 mins towel roll for lumbar support -PT present to discuss status Isometric plantarflexion with green band 3x 45 sec  Isometric inversion with ball 4 x 30 sec Lt  Isometric eversion against her other foot 4x 30 sec Left short foot in standing x 12 Left short foot standing on airex x  12 Standing on airex +Heel raise x 12 Standing: left short foot holding 5 sec while moving right  right foot forward and back and side to side 2x8 (attempted standing on airex but patient had increased pain. Performed on solid ground) KT tape intact on medial side of ankle/leg, instructed to remove in the next 24-36 hours to assess skin tolerance    01/27/2024 NuStep Level 3 10 mins towel roll for lumbar support -PT present to discuss status KT tape intact on medial side of ankle/leg, instructed to remove in the next 24-36 hours to assess skin tolerance Isometric plantarflexion with green band 3x 45 sec  Seated ball squeeze with heel raises 10x 2 Standing: left short foot holding 5 sec while moving right  right foot forward and back and side to side 5x 2 sets  Isometric inversion with ball 5x 30 sec Isometric dorsiflexion against her other foot 5x 30 sec Isometric eversion against her other foot 5x 30 sec  01/25/2024 NuStep Level 3 10 mins towel roll for lumbar support -PT present to discuss status Discussed contrast bath concept  Discussion of using of her husband's electrotherapy pad he uses for neuropathy lowest setting, shorter duration  Isometric plantarflexion with red band 5x 30 sec (felt better with shoe off) Isometric inversion with ball 5x 30 sec Isometric dorsiflexion against her other foot 5x 30 sec Isometric eversion against her other foot 5x 30 sec Standing short foot arch lifts 10x; discussed progressing to holding the lift 5-10 sec  Kinesiotape: One long strip anchored to dorsal foot wrapped under plantar aspect and up the medial side of her leg for pain and edema control    PATIENT EDUCATION:  Education details: PT eval findings, anticipated POC, progress with PT, and initial HEP Person educated: Patient Education method: Explanation, Demonstration, and Handouts Education comprehension: verbalized understanding, returned demonstration, and needs further education  HOME  EXERCISE PROGRAM: Access Code: [email or web address] URL: [email or web address] Date: 01/06/2024 Prepared by: Glade Pesa  Exercises - Supine Ankle Pumps in Elevation on Pillows  - 1-2 x daily - 7 x weekly - 2 sets - 10 reps - Seated Ankle Alphabet  - 1-2 x daily - 7 x weekly - 1 sets - Long Sitting Calf Stretch with Strap  - 1-2 x daily - 7 x weekly - 2 sets - 20-30s hold - Seated Ankle Inversion with Resistance and Legs Crossed  - 1 x daily - 7 x weekly - 2 sets -  10 reps - Long Sitting Ankle Eversion with Resistance  - 1 x daily - 7 x weekly - 2 sets - 10 reps - Ankle Inversion with Resistance  - 1 x daily - 7 x weekly - 2 sets - 10 reps - Gastroc Stretch with Foot at Wall  - 1 x daily - 7 x weekly - 1 sets - 2-3 reps - 30 hold - Seated Arch Lifts  - 1 x daily - 7 x weekly - 1 sets - 10 reps  ASSESSMENT:  CLINICAL IMPRESSION: The patient's pain level is improving following a recent exacerbation after x-rays were done of her ankle/foot.  She is able to increase weight bearing exercises on her left foot with light single arm support only.  Therapist providing verbal cues to optimize technique with exercises in order to achieve the greatest benefit.  She has responded well to KT taping for medial ankle/foot support and will continue to wear a few more days since her skin has tolerated it well.        OBJECTIVE IMPAIRMENTS: Abnormal gait, decreased activity tolerance, decreased balance, difficulty walking, decreased ROM, decreased strength, hypomobility, increased edema, impaired perceived functional ability, increased muscle spasms, impaired flexibility, and pain.   ACTIVITY LIMITATIONS: standing, stairs, transfers, and locomotion level  PARTICIPATION LIMITATIONS: meal prep, cleaning, interpersonal relationship, shopping, community activity, and yard work  PERSONAL FACTORS: Age and Time since onset of injury/illness/exacerbation are also affecting patient's functional outcome.    REHAB POTENTIAL: Good  CLINICAL DECISION MAKING: Evolving/moderate complexity  EVALUATION COMPLEXITY: Moderate   GOALS: Goals reviewed with patient? Yes  SHORT TERM GOALS: Target date: 01/25/2024  Patient will be independent with initial HEP. Baseline:  Goal status: met 8/7  2.  Patient will report > or = to 30%  improvement in left ankle pain since starting PT. Baseline:  Goal status: met 8/13   3.  Patient will be able to ascend/ descend a curb with LRAD for improved community mobility. Baseline:  Goal status: met 8/7   LONG TERM GOALS: Target date: 02/22/2024  Patient will demonstrate independence in advanced HEP. Baseline:  Goal status: ongoing  2.  Patient will report > or = to 60% improvement in left ankle pain since starting PT. Baseline:  Goal status: ongoing  3.  Patient will verbalize and demonstrate self-care strategies to manage pain including tissue mobility practices and change of position. Baseline:  Goal status: ongoing  4.  Patient will be able to walk > or = to 10,000 steps per day with < or = to 2/10 left ankle discomfort. Baseline:  Goal status: ongoing  5.  Patient will score > or = to 63/80 on LEFS due to improved ability to perform functional activities.  Baseline: 54/80 Goal status: ongoing   PLAN:  PT FREQUENCY: 2x/week  PT DURATION: 8 weeks  PLANNED INTERVENTIONS: [id number]- PT Re-evaluation, 97110-Therapeutic exercises, 97530- Therapeutic activity, 97112- Neuromuscular re-education, 97535- Self Care, [id number]- Manual therapy, [id number]- Gait training, [phone]- Canalith repositioning, [id number]- Aquatic Therapy, [phone]- Electrical stimulation (unattended), [phone]- Electrical stimulation (manual), [id number]- Vasopneumatic device, [id number]- Ultrasound, [id number]- Traction (mechanical), [id number]- Ionotophoresis 4mg /ml Dexamethasone , [id number] (1-2 muscles), [id number] (3+ muscles)- Dry Needling, Patient/Family education, Balance training, Stair training, Taping, Joint  mobilization, Joint manipulation, Spinal manipulation, Spinal mobilization, Compression bandaging, Vestibular training, Moist heat, and Biofeedback  PLAN FOR NEXT SESSION: 10th visit progress note; LEFS; check LTGs;  side stepping with yellow TB around foot to HEP; gentle strengthening including foot intrinsics   Glade Pesa, PT  02/02/24 4:59 PM Phone: [phone] Fax: [phone]  [facility] [address], [address] [place], [place] [address] Phone # [phone] Fax [phone]

## 2024-02-08 ENCOUNTER — Encounter: Admitting: Physical Therapy

## 2024-02-10 ENCOUNTER — Encounter: Admitting: Physical Therapy

## 2024-02-15 ENCOUNTER — Ambulatory Visit: Admitting: Physical Therapy

## 2024-02-15 DIAGNOSIS — R600 Localized edema: Secondary | ICD-10-CM

## 2024-02-15 DIAGNOSIS — M25572 Pain in left ankle and joints of left foot: Secondary | ICD-10-CM

## 2024-02-15 DIAGNOSIS — M6281 Muscle weakness (generalized): Secondary | ICD-10-CM

## 2024-02-15 DIAGNOSIS — R2689 Other abnormalities of gait and mobility: Secondary | ICD-10-CM

## 2024-02-15 NOTE — Therapy (Signed)
 OUTPATIENT PHYSICAL THERAPY LOWER EXTREMITY TREATMENT   Patient Name: Carol Lowery MRN: [id number] DOB:[birth year], 83 y.o., female Today's Date: 02/15/2024  Progress Note Reporting Period 12/28/23 to 02/15/24  See note below for Objective Data and Assessment of Progress/Goals.      END OF SESSION:  PT End of Session - 02/15/24 1351     Visit Number 10    Date for PT Re-Evaluation 02/22/24    Authorization Type Medicare/[employer]na Supplement    Progress Note Due on Visit 20    PT Start Time 1400    PT Stop Time 1440    PT Time Calculation (min) 40 min    Activity Tolerance Patient tolerated treatment well             Past Medical History:  Diagnosis Date   Anemia    Arthritis    generalized   Asthma    cough varient asthma;seasonal   Complication of anesthesia    slow to awaken   GERD (gastroesophageal reflux disease)    Head concussion 2018   causes decreased hearing   Headache    Heart murmur    as a child and while pregnant   Hiatal hernia    History of blood transfusion 1970   6 pints after childbirth   Hypothyroidism    Patent foramen ovale    TEE 08/14/15 [facility]): LVEF 55-60%, no RWMA, PFO with minimal right to left shunt, no ASD, mild MR, small fibrinous pericardial effsuion   Renal cyst    per patient benign   Thyroid disease    Past Surgical History:  Procedure Laterality Date   ABDOMINAL HYSTERECTOMY     APPENDECTOMY     appendectomy of left toe     COLONOSCOPY     DIAGNOSTIC LAPAROSCOPY     bilateral knees   ESOPHAGOGASTRODUODENOSCOPY N/A 12/10/2021   Procedure: ESOPHAGOGASTRODUODENOSCOPY (EGD);  Surgeon: Shyrl Linnie KIDD, MD;  Location: [facility] OR;  Service: Thoracic;  Laterality: N/A;   ESOPHAGOGASTRODUODENOSCOPY N/A 04/19/2023   Procedure: ESOPHAGOGASTRODUODENOSCOPY (EGD) WITH SAVORY DILATION;  Surgeon: Shyrl Linnie KIDD, MD;  Location: [facility] OR;  Service: Thoracic;  Laterality: N/A;   EYE SURGERY     cataract removed bilaterally    JOINT REPLACEMENT     knee replacement left Left    RADICAL VAGINAL HYSTERECTOMY     right carpal tunnel release     TONSILLECTOMY     tonsils removed     UPPER GASTROINTESTINAL ENDOSCOPY     XI ROBOTIC ASSISTED PARAESOPHAGEAL HERNIA REPAIR N/A 12/10/2021   Procedure: XI ROBOTIC ASSISTED PARAESOPHAGEAL HERNIA REPAIR WITH FUNDOPLICATION;  Surgeon: Shyrl Linnie KIDD, MD;  Location: [facility] OR;  Service: Thoracic;  Laterality: N/A;   Patient Active Problem List   Diagnosis Date Noted   S/P repair of paraesophageal hernia 12/10/2021   Acquired hypothyroidism 09/12/2021   Congenital heart disease 09/12/2021   Decreased estrogen level 09/12/2021   Hiatal hernia 09/12/2021   Iron deficiency anemia 09/12/2021   Pure hypercholesterolemia 09/12/2021   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  Had company last week and did lots of stairs and walking.  20,000 steps some days.   50-60% better overall. My back hurts today.  Been using Salonpas.    From Eval:Patient presents with left ankle pain that began April 2025, but it is a chronic problem. 40 years ago [place]. Nothing was broken but it bruised really bad. She just has occasional tendonitis flare ups.  Dr. Gershon gave her a boot to wear a week ago but it really flared up her back pain so she went back to the supportive ankle  brace. She likes this better. She elevates her ankle when she sits and ices it every two hours for 20 mins. This does help decrease pain. She is still able to keep her steps up (her goal is 10,000 per day), but with increased activity the pain is worse.  PERTINENT HISTORY: OA; Hypothyroidism; Hx Left knee replacement; years ago left toe fractures when dropped a vacumn cleaner attachment on them  PAIN:  02/15/24: Are you having pain? Yes: NPRS scale: 4-5/10, at rest 1/10 Pain location: Lt Medial malleoli and feels tight top of the ankle  Pain description: sharp, shooting Aggravating factors: Touching the area; running multiple errands, going up a curb Relieving factors: ice; elevation Advil when the pain is really bad   PRECAUTIONS: None  RED FLAGS: None   WEIGHT BEARING RESTRICTIONS: No  FALLS:  Has patient fallen in last 6 months? No  LIVING ENVIRONMENT: Lives with: lives with their spouse Lives in: House/apartment Stairs: yes stairs but her room is on the main level; stairs to come into the house Has following equipment at home: Grab bars  OCCUPATION: Retired; worked for a church  PLOF: Independent, Independent with basic ADLs, Independent with household mobility without device, Independent with community mobility without device, Independent with gait, Independent with transfers, and Leisure: Travel; hang out with family  PATIENT GOALS: To improve ROM, strength & function   NEXT MD VISIT: 3 weeks  OBJECTIVE:  Note: Objective measures were completed at Evaluation unless otherwise noted.  DIAGNOSTIC FINDINGS:  Ankle MRI 6/19 Impression: 1. Infiltration of the sinus Tarsi and mechanical stress marrow edema around the subtalar joint/sinus Tarsi 2. Proximal plantar aponeurosis degeneration and small plantar calcaneal spur 3. Thickened intermediate signal anterior talofibular and calcaneofibular ligaments 4. Flexor and peroneus longus/brevis tenosynovitis and lesser  extensor tenosynovitis 5. Mild edema of the distal calf muscles possibly use or nerve related 6. Marrow edema at the medial malleolar margin and subcutaneous edema at the inner ankle 7. Hindfoot valgus deformity and suggestion of mild abutment between the lateral malleolus and posterior outer calcaneus  PATIENT SURVEYS:  LEFS: 54/80 67.5%  8/26:  59 % (pt also has knee pain and back pain which affects her function)   COGNITION: Overall cognitive status: Within functional limits for tasks assessed     SENSATION: WFL  EDEMA:  Increased edema Lt medial malleoli    POSTURE: rounded shoulders  PALPATION: Tenderness with palpation around medial malleoli Tenderness with palpation tibialis anterior & calf   LOWER EXTREMITY MNF:EMNF WFL; more discomfort at end range movements    LOWER EXTREMITY MMT: MMT limited due to pain with resistance. Inversion more painful than eversion. Grossly 4-/5   FUNCTIONAL TESTS:  5 times sit to stand: 10.79 sec no UE  support (no brace) Timed up and go (TUG): 10.30 sec (no brace; pain)  8/26:  5x STS: 7.20 sec no hands            TUG 6.22   GAIT: Comments: Antalgic gait; decreased stance on Lt LE                                                                                                                                 TREATMENT DATE: 02/15/2024 NuStep Level 5 10 mins towel roll for lumbar support -PT present to discuss status LEFS 5x STS TUG Seated 5# KB resting on knee: toes on small step heel raises (deficit heel lift) 20x Single leg modified RDL with left touch on barre, WB on left 20x Gastroc stretch on wall 30 sec x3 WB on left with circles touches with right toes (circle formation) anticipated and random Standing short foot arch lifts 30 sec; added isometric hold with right toe taps 4 ways 10x  Bil heel raises with ball squeeze b/w heels 20x   02/02/2024 NuStep Level 5 10 mins towel roll for lumbar support -PT present to  discuss status Seated 5# KB resting on knee: toes on small step heel raises (deficit heel lift) 20x Staggered stance with right light toe touch with 5# KB hip to hip 2x 10 Single leg modified RDL with left touch on barre, WB on left 10x Gastroc stretch on wall 30 sec x3 WB on left with circles touches with right toes (circle formation) anticipated and random Standing short foot arch lifts 5 sec holds 7 Review of home isometrics Pt given a strip of KT tape for home application since she is unable to come to PT next week   01/31/2024 NuStep Level 5 8 mins towel roll for lumbar support -PT present to discuss status Isometric plantarflexion with green band 3x 45 sec  Isometric inversion with ball 4 x 30 sec Lt  Isometric eversion against her other foot 4x 30 sec Left short foot in standing x 12 Left short foot standing on airex x 12 Standing on airex +Heel raise x 12 Standing: left short foot holding 5 sec while moving right  right foot forward and back and side to side 2x8 (attempted standing on airex but patient had increased pain. Performed on solid ground) KT tape intact on medial side of ankle/leg, instructed to remove in the next 24-36 hours to assess skin tolerance    01/27/2024 NuStep Level 3 10 mins towel roll for lumbar support -PT present to discuss status KT tape intact on medial side of ankle/leg, instructed to remove in the next 24-36 hours to assess skin tolerance Isometric plantarflexion with green band 3x 45 sec  Seated ball squeeze with heel raises 10x 2 Standing: left short foot holding 5 sec while moving right  right foot forward and back and side to side 5x 2 sets  Isometric inversion with ball 5x 30 sec Isometric  dorsiflexion against her other foot 5x 30 sec Isometric eversion against her other foot 5x 30 sec  01/25/2024 NuStep Level 3 10 mins towel roll for lumbar support -PT present to discuss status Discussed contrast bath concept  Discussion of using of her  husband's electrotherapy pad he uses for neuropathy lowest setting, shorter duration  Isometric plantarflexion with red band 5x 30 sec (felt better with shoe off) Isometric inversion with ball 5x 30 sec Isometric dorsiflexion against her other foot 5x 30 sec Isometric eversion against her other foot 5x 30 sec Standing short foot arch lifts 10x; discussed progressing to holding the lift 5-10 sec  Kinesiotape: One long strip anchored to dorsal foot wrapped under plantar aspect and up the medial side of her leg for pain and edema control    PATIENT EDUCATION:  Education details: PT eval findings, anticipated POC, progress with PT, and initial HEP Person educated: Patient Education method: Explanation, Demonstration, and Handouts Education comprehension: verbalized understanding, returned demonstration, and needs further education  HOME EXERCISE PROGRAM: Access Code: [email or web address] URL: [email or web address] Date: 01/06/2024 Prepared by: Glade Pesa  Exercises - Supine Ankle Pumps in Elevation on Pillows  - 1-2 x daily - 7 x weekly - 2 sets - 10 reps - Seated Ankle Alphabet  - 1-2 x daily - 7 x weekly - 1 sets - Long Sitting Calf Stretch with Strap  - 1-2 x daily - 7 x weekly - 2 sets - 20-30s hold - Seated Ankle Inversion with Resistance and Legs Crossed  - 1 x daily - 7 x weekly - 2 sets - 10 reps - Long Sitting Ankle Eversion with Resistance  - 1 x daily - 7 x weekly - 2 sets - 10 reps - Ankle Inversion with Resistance  - 1 x daily - 7 x weekly - 2 sets - 10 reps - Gastroc Stretch with Foot at Wall  - 1 x daily - 7 x weekly - 1 sets - 2-3 reps - 30 hold - Seated Arch Lifts  - 1 x daily - 7 x weekly - 1 sets - 10 reps  ASSESSMENT:  CLINICAL IMPRESSION: The patient reports a 50-60% improvement in ankle/foot pain since the start of care.  TUG and 5x STS functional tests improved significantly and are in the normal range for age/gender.  Although she continues to have some foot  pain she is able to walk > 10,000 steps most days.  She is able to perform weight bearing ex's today without pain exacerbation.  Will finalize HEP over the next 2 visits and anticipate readiness for discharge from PT at that time.     OBJECTIVE IMPAIRMENTS: Abnormal gait, decreased activity tolerance, decreased balance, difficulty walking, decreased ROM, decreased strength, hypomobility, increased edema, impaired perceived functional ability, increased muscle spasms, impaired flexibility, and pain.   ACTIVITY LIMITATIONS: standing, stairs, transfers, and locomotion level  PARTICIPATION LIMITATIONS: meal prep, cleaning, interpersonal relationship, shopping, community activity, and yard work  PERSONAL FACTORS: Age and Time since onset of injury/illness/exacerbation are also affecting patient's functional outcome.   REHAB POTENTIAL: Good  CLINICAL DECISION MAKING: Evolving/moderate complexity  EVALUATION COMPLEXITY: Moderate   GOALS: Goals reviewed with patient? Yes  SHORT TERM GOALS: Target date: 01/25/2024  Patient will be independent with initial HEP. Baseline:  Goal status: met 8/7  2.  Patient will report > or = to 30%  improvement in left ankle pain since starting PT. Baseline:  Goal status: met 8/13   3.  Patient will be able  to ascend/ descend a curb with LRAD for improved community mobility. Baseline:  Goal status: met 8/7   LONG TERM GOALS: Target date: 02/22/2024  Patient will demonstrate independence in advanced HEP. Baseline:  Goal status: ongoing  2.  Patient will report > or = to 60% improvement in left ankle pain since starting PT. Baseline:  Goal status: met 8/26  3.  Patient will verbalize and demonstrate self-care strategies to manage pain including tissue mobility practices and change of position. Baseline:  Goal status: ongoing  4.  Patient will be able to walk > or = to 10,000 steps per day with < or = to 2/10 left ankle discomfort. Baseline:  Goal  status: met 8/26  5.  Patient will score > or = to 63/80 on LEFS due to improved ability to perform functional activities.  Baseline: 59/80 Goal status: ongoing   PLAN:  PT FREQUENCY: 2x/week  PT DURATION: 8 weeks  PLANNED INTERVENTIONS: 97164- PT Re-evaluation, 97110-Therapeutic exercises, 97530- Therapeutic activity, 97112- Neuromuscular re-education, 97535- Self Care, [id number]- Manual therapy, [phone]- Gait training, [phone]- Canalith repositioning, [phone]- Aquatic Therapy, [phone]- Electrical stimulation (unattended), [phone]- Electrical stimulation (manual), [id number]- Vasopneumatic device, [id number]- Ultrasound, [id number]- Traction (mechanical), [id number]- Ionotophoresis 4mg /ml Dexamethasone , [id number] (1-2 muscles), [id number] (3+ muscles)- Dry Needling, Patient/Family education, Balance training, Stair training, Taping, Joint mobilization, Joint manipulation, Spinal manipulation, Spinal mobilization, Compression bandaging, Vestibular training, Moist heat, and Biofeedback  PLAN FOR NEXT SESSION: feels ready for discharge next week;   finalize HEP; side stepping with yellow TB around foot to HEP; gentle strengthening including foot intrinsics  Glade Pesa, PT 02/15/24 10:15 PM Phone: [phone] Fax: [phone]   [facility] [address] [place], [place] [address] Phone # [phone] Fax [phone]

## 2024-02-17 ENCOUNTER — Ambulatory Visit: Admitting: Physical Therapy

## 2024-02-17 ENCOUNTER — Encounter: Payer: Self-pay | Admitting: Physical Therapy

## 2024-02-17 DIAGNOSIS — R600 Localized edema: Secondary | ICD-10-CM

## 2024-02-17 DIAGNOSIS — M25572 Pain in left ankle and joints of left foot: Secondary | ICD-10-CM

## 2024-02-17 DIAGNOSIS — R2689 Other abnormalities of gait and mobility: Secondary | ICD-10-CM

## 2024-02-17 DIAGNOSIS — M6281 Muscle weakness (generalized): Secondary | ICD-10-CM

## 2024-02-17 NOTE — Therapy (Signed)
 OUTPATIENT PHYSICAL THERAPY LOWER EXTREMITY TREATMENT   Patient Name: Carol Lowery MRN: [id number] DOB:1940/11/21, 83 y.o., female Today's Date: 02/17/2024     END OF SESSION:  PT End of Session - 02/17/24 1508     Visit Number 11    Date for PT Re-Evaluation 02/22/24    Authorization Type Medicare/[employer]etna Supplement    Progress Note Due on Visit 30    PT Start Time 1403    PT Stop Time 1443    PT Time Calculation (min) 40 min    Activity Tolerance Patient tolerated treatment well    Behavior During Therapy [facility] for tasks assessed/performed              Past Medical History:  Diagnosis Date   Anemia    Arthritis    generalized   Asthma    cough varient asthma;seasonal   Complication of anesthesia    slow to awaken   GERD (gastroesophageal reflux disease)    Head concussion 2018   causes decreased hearing   Headache    Heart murmur    as a child and while pregnant   Hiatal hernia    History of blood transfusion 1970   6 pints after childbirth   Hypothyroidism    Patent foramen ovale    TEE 08/14/15 [facility]): LVEF 55-60%, no RWMA, PFO with minimal right to left shunt, no ASD, mild MR, small fibrinous pericardial effsuion   Renal cyst    per patient benign   Thyroid disease    Past Surgical History:  Procedure Laterality Date   ABDOMINAL HYSTERECTOMY     APPENDECTOMY     appendectomy of left toe     COLONOSCOPY     DIAGNOSTIC LAPAROSCOPY     bilateral knees   ESOPHAGOGASTRODUODENOSCOPY N/A 12/10/2021   Procedure: ESOPHAGOGASTRODUODENOSCOPY (EGD);  Surgeon: Shyrl Linnie KIDD, MD;  Location: [facility] OR;  Service: Thoracic;  Laterality: N/A;   ESOPHAGOGASTRODUODENOSCOPY N/A 04/19/2023   Procedure: ESOPHAGOGASTRODUODENOSCOPY (EGD) WITH SAVORY DILATION;  Surgeon: Shyrl Linnie KIDD, MD;  Location: [facility] OR;  Service: Thoracic;  Laterality: N/A;   EYE SURGERY     cataract removed bilaterally   JOINT REPLACEMENT     knee replacement left Left     RADICAL VAGINAL HYSTERECTOMY     right carpal tunnel release     TONSILLECTOMY     tonsils removed     UPPER GASTROINTESTINAL ENDOSCOPY     XI ROBOTIC ASSISTED PARAESOPHAGEAL HERNIA REPAIR N/A 12/10/2021   Procedure: XI ROBOTIC ASSISTED PARAESOPHAGEAL HERNIA REPAIR WITH FUNDOPLICATION;  Surgeon: Shyrl Linnie KIDD, MD;  Location: [facility] OR;  Service: Thoracic;  Laterality: N/A;   Patient Active Problem List   Diagnosis Date Noted   S/P repair of paraesophageal hernia 12/10/2021   Acquired hypothyroidism 09/12/2021   Congenital heart disease 09/12/2021   Decreased estrogen level 09/12/2021   Hiatal hernia 09/12/2021   Iron deficiency anemia 09/12/2021   Pure hypercholesterolemia 09/12/2021   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 She is not currently having any pain.    From Eval:Patient presents with left ankle pain that began April 2025, but it is a chronic problem. 40 years [place]. Nothing was broken but it bruised really bad. She just has occasional tendonitis flare ups.  Dr. Gershon gave her a boot to wear a week ago but it really flared up her back pain so she went back to the supportive ankle brace. She likes this better. She elevates her ankle when she sits and ices it every two hours for 20 mins. This does help decrease pain. She is still able to keep her steps up (her goal is 10,000  per day), but with increased activity the pain is worse.  PERTINENT HISTORY: OA; Hypothyroidism; Hx Left knee replacement; years ago left toe fractures when dropped a vacumn cleaner attachment on them  PAIN:  02/15/24: Are you having pain? Yes: NPRS scale: 4-5/10, at rest 1/10 Pain location: Lt Medial malleoli and feels tight top of the ankle  Pain description: sharp, shooting Aggravating factors: Touching the area; running multiple errands, going up a curb Relieving factors: ice; elevation Advil when the pain is really bad   PRECAUTIONS: None  RED FLAGS: None   WEIGHT BEARING RESTRICTIONS: No  FALLS:  Has patient fallen in last 6 months? No  LIVING ENVIRONMENT: Lives with: lives with their spouse Lives in: House/apartment Stairs: yes stairs but her room is on the main level; stairs to come into the house Has following equipment at home: Grab bars  OCCUPATION: Retired; worked for a church  PLOF: Independent, Independent with basic ADLs, Independent with household mobility without device, Independent with community mobility without device, Independent with gait, Independent with transfers, and Leisure: Travel; hang out with family  PATIENT GOALS: To improve ROM, strength & function   NEXT MD VISIT: 3 weeks  OBJECTIVE:  Note: Objective measures were completed at Evaluation unless otherwise noted.  DIAGNOSTIC FINDINGS:  Ankle MRI 6/19 Impression: 1. Infiltration of the sinus Tarsi and mechanical stress marrow edema around the subtalar joint/sinus Tarsi 2. Proximal plantar aponeurosis degeneration and small plantar calcaneal spur 3. Thickened intermediate signal anterior talofibular and calcaneofibular ligaments 4. Flexor and peroneus longus/brevis tenosynovitis and lesser extensor tenosynovitis 5. Mild edema of the distal calf muscles possibly use or nerve related 6. Marrow edema at the medial malleolar margin and subcutaneous edema at the inner ankle 7. Hindfoot  valgus deformity and suggestion of mild abutment between the lateral malleolus and posterior outer calcaneus  PATIENT SURVEYS:  LEFS: 54/80 67.5%  8/26:  59 % (pt also has knee pain and back pain which affects her function)   COGNITION: Overall cognitive status: Within functional limits for tasks assessed     SENSATION: WFL  EDEMA:  Increased edema Lt medial malleoli    POSTURE: rounded shoulders  PALPATION: Tenderness with palpation around medial malleoli Tenderness with palpation tibialis anterior & calf   LOWER EXTREMITY MNF:EMNF WFL; more discomfort at end range movements    LOWER EXTREMITY MMT: MMT limited due to pain with resistance. Inversion more painful than eversion. Grossly 4-/5   FUNCTIONAL TESTS:  5 times sit to stand: 10.79 sec no UE support (no brace) Timed up and go (TUG): 10.30 sec (no brace; pain)  8/26:  5x STS: 7.20 sec no hands            TUG 6.22   GAIT:  Comments: Antalgic gait; decreased stance on Lt LE                                                                                                                                 TREATMENT DATE: 02/17/2024 NuStep Level 5 10 mins towel roll for lumbar support -PT present to discuss status Left leg on theraband + oppsite leg kick (forwards,side,backwards) x 10 Forward T x 10 then added 5# KB x 10 unilateral UE support at barre Sit to stand holding 5# DB x 10 Standing heel raise with ball in between 2 x 10 Mini Squats on airex 2 x 10 Single leg stance + cone tap with Rt (two cones in front + patient having to cross midline) Step ups at stair with Lt x 10    02/15/2024 NuStep Level 5 10 mins towel roll for lumbar support -PT present to discuss status LEFS 5x STS TUG Seated 5# KB resting on knee: toes on small step heel raises (deficit heel lift) 20x Single leg modified RDL with left touch on barre, WB on left 20x Gastroc stretch on wall 30 sec x3 WB on left with circles touches with  right toes (circle formation) anticipated and random Standing short foot arch lifts 30 sec; added isometric hold with right toe taps 4 ways 10x  Bil heel raises with ball squeeze b/w heels 20x   02/02/2024 NuStep Level 5 10 mins towel roll for lumbar support -PT present to discuss status Seated 5# KB resting on knee: toes on small step heel raises (deficit heel lift) 20x Staggered stance with right light toe touch with 5# KB hip to hip 2x 10 Single leg modified RDL with left touch on barre, WB on left 10x Gastroc stretch on wall 30 sec x3 WB on left with circles touches with right toes (circle formation) anticipated and random Standing short foot arch lifts 5 sec holds 7 Review of home isometrics Pt given a strip of KT tape for home application since she is unable to come to PT next week   01/31/2024 NuStep Level 5 8 mins towel roll for lumbar support -PT present to discuss status Isometric plantarflexion with green band 3x 45 sec  Isometric inversion with ball 4 x 30 sec Lt  Isometric eversion against her other foot 4x 30 sec Left short foot in standing x 12 Left short foot standing on airex x 12 Standing on airex +Heel raise x 12 Standing: left short foot holding 5 sec while moving right  right foot forward and back and side to side 2x8 (attempted standing on airex but patient had increased pain. Performed on solid ground) KT tape intact on medial side of ankle/leg, instructed to remove in the next 24-36 hours to assess skin tolerance   PATIENT EDUCATION:  Education details: PT eval findings, anticipated POC, progress with PT, and initial HEP Person educated: Patient Education method: Explanation, Demonstration, and Handouts Education comprehension: verbalized  understanding, returned demonstration, and needs further education  HOME EXERCISE PROGRAM: Access Code: [email or web address] URL: [email or web address] Date: 02/17/2024 Prepared by: Kristeen Sar  Exercises - Supine  Ankle Pumps in Elevation on Pillows  - 1-2 x daily - 7 x weekly - 2 sets - 10 reps - Seated Ankle Alphabet  - 1-2 x daily - 7 x weekly - 1 sets - Long Sitting Calf Stretch with Strap  - 1-2 x daily - 7 x weekly - 2 sets - 20-30s hold - Seated Ankle Inversion with Resistance and Legs Crossed  - 1 x daily - 7 x weekly - 2 sets - 10 reps - Long Sitting Ankle Eversion with Resistance  - 1 x daily - 7 x weekly - 2 sets - 10 reps - Ankle Inversion with Resistance  - 1 x daily - 7 x weekly - 2 sets - 10 reps - Gastroc Stretch with Foot at Wall  - 1 x daily - 7 x weekly - 1 sets - 2-3 reps - 30 hold - Seated Arch Lifts  - 1 x daily - 7 x weekly - 1 sets - 10 reps - Standing Calf Raise With Small Ball at Heels  - 1 x daily - 7 x weekly - 3 sets - 10 reps - Forward T with Counter Support  - 1 x daily - 7 x weekly - 3 sets - 10 reps - Sit to Stand  - 1 x daily - 7 x weekly - 1-2 sets - 10 reps - Side Lunge with Counter Support  - 1 x daily - 7 x weekly - 1 sets - 5 reps - Step Up  - 1 x daily - 7 x weekly - 1-2 sets - 10 reps  ASSESSMENT:  CLINICAL IMPRESSION: Patient verbalized feeling 75% better since starting therapy. Updated HEP to include exercise progressions. Incorporated varying directions and surfaces for improved ankle stability. Patient is able to perform all of her normal functional activities with no difficulty. Plan to discharge patient next treatment session.    OBJECTIVE IMPAIRMENTS: Abnormal gait, decreased activity tolerance, decreased balance, difficulty walking, decreased ROM, decreased strength, hypomobility, increased edema, impaired perceived functional ability, increased muscle spasms, impaired flexibility, and pain.   ACTIVITY LIMITATIONS: standing, stairs, transfers, and locomotion level  PARTICIPATION LIMITATIONS: meal prep, cleaning, interpersonal relationship, shopping, community activity, and yard work  PERSONAL FACTORS: Age and Time since onset of  injury/illness/exacerbation are also affecting patient's functional outcome.   REHAB POTENTIAL: Good  CLINICAL DECISION MAKING: Evolving/moderate complexity  EVALUATION COMPLEXITY: Moderate   GOALS: Goals reviewed with patient? Yes  SHORT TERM GOALS: Target date: 01/25/2024  Patient will be independent with initial HEP. Baseline:  Goal status: met 8/7  2.  Patient will report > or = to 30%  improvement in left ankle pain since starting PT. Baseline:  Goal status: met 8/13   3.  Patient will be able to ascend/ descend a curb with LRAD for improved community mobility. Baseline:  Goal status: met 8/7   LONG TERM GOALS: Target date: 02/22/2024  Patient will demonstrate independence in advanced HEP. Baseline:  Goal status: ongoing  2.  Patient will report > or = to 60% improvement in left ankle pain since starting PT. Baseline:  Goal status: met 8/26  3.  Patient will verbalize and demonstrate self-care strategies to manage pain including tissue mobility practices and change of position. Baseline:  Goal status: ongoing  4.  Patient will be able to walk > or =  to 10,000 steps per day with < or = to 2/10 left ankle discomfort. Baseline:  Goal status: met 8/26  5.  Patient will score > or = to 63/80 on LEFS due to improved ability to perform functional activities.  Baseline: 59/80 Goal status: ongoing   PLAN:  PT FREQUENCY: 2x/week  PT DURATION: 8 weeks  PLANNED INTERVENTIONS: [id number]- PT Re-evaluation, 97110-Therapeutic exercises, 97530- Therapeutic activity, 97112- Neuromuscular re-education, 97535- Self Care, [id number]- Manual therapy, [phone]- Gait training, [phone]- Canalith repositioning, [id number]- Aquatic Therapy, [phone]- Electrical stimulation (unattended), [phone]- Electrical stimulation (manual), [id number]- Vasopneumatic device, [id number]- Ultrasound, [id number]- Traction (mechanical), [id number]- Ionotophoresis 4mg /ml Dexamethasone , [id number] (1-2 muscles), [id number] (3+ muscles)- Dry Needling,  Patient/Family education, Balance training, Stair training, Taping, Joint mobilization, Joint manipulation, Spinal manipulation, Spinal mobilization, Compression bandaging, Vestibular training, Moist heat, and Biofeedback  PLAN FOR NEXT SESSION: assess updated HEP; discharge patient    Kristeen Sar, PT 02/17/24 3:10 PM [facility] [address] [place], [place] [address] Phone # [phone] Fax [phone]

## 2024-02-22 ENCOUNTER — Ambulatory Visit: Attending: Podiatry | Admitting: Physical Therapy

## 2024-02-22 ENCOUNTER — Encounter: Payer: Self-pay | Admitting: Physical Therapy

## 2024-02-22 DIAGNOSIS — M25572 Pain in left ankle and joints of left foot: Secondary | ICD-10-CM | POA: Diagnosis present

## 2024-02-22 DIAGNOSIS — M6281 Muscle weakness (generalized): Secondary | ICD-10-CM | POA: Insufficient documentation

## 2024-02-22 DIAGNOSIS — R2689 Other abnormalities of gait and mobility: Secondary | ICD-10-CM | POA: Diagnosis present

## 2024-02-22 DIAGNOSIS — R600 Localized edema: Secondary | ICD-10-CM | POA: Insufficient documentation

## 2024-02-22 NOTE — Therapy (Signed)
 OUTPATIENT PHYSICAL THERAPY LOWER EXTREMITY TREATMENT/DISCHARGE SUMMARY    Patient Name: Bridgit Eynon MRN: [id number] DOB:08/09/1940, 83 y.o., female Today's Date: 02/22/2024     END OF SESSION:  PT End of Session - 02/22/24 1404     Visit Number 12    Date for PT Re-Evaluation 02/22/24    Authorization Type Medicare/[employer]etna Supplement    Progress Note Due on Visit 30    PT Start Time 1400    PT Stop Time 1440    PT Time Calculation (min) 40 min    Activity Tolerance Patient tolerated treatment well              Past Medical History:  Diagnosis Date   Anemia    Arthritis    generalized   Asthma    cough varient asthma;seasonal   Complication of anesthesia    slow to awaken   GERD (gastroesophageal reflux disease)    Head concussion 2018   causes decreased hearing   Headache    Heart murmur    as a child and while pregnant   Hiatal hernia    History of blood transfusion 1970   6 pints after childbirth   Hypothyroidism    Patent foramen ovale    TEE 08/14/15 [facility]): LVEF 55-60%, no RWMA, PFO with minimal right to left shunt, no ASD, mild MR, small fibrinous pericardial effsuion   Renal cyst    per patient benign   Thyroid disease    Past Surgical History:  Procedure Laterality Date   ABDOMINAL HYSTERECTOMY     APPENDECTOMY     appendectomy of left toe     COLONOSCOPY     DIAGNOSTIC LAPAROSCOPY     bilateral knees   ESOPHAGOGASTRODUODENOSCOPY N/A 12/10/2021   Procedure: ESOPHAGOGASTRODUODENOSCOPY (EGD);  Surgeon: Shyrl Linnie KIDD, MD;  Location: [facility] OR;  Service: Thoracic;  Laterality: N/A;   ESOPHAGOGASTRODUODENOSCOPY N/A 04/19/2023   Procedure: ESOPHAGOGASTRODUODENOSCOPY (EGD) WITH SAVORY DILATION;  Surgeon: Shyrl Linnie KIDD, MD;  Location: [facility] OR;  Service: Thoracic;  Laterality: N/A;   EYE SURGERY     cataract removed bilaterally   JOINT REPLACEMENT     knee replacement left Left    RADICAL VAGINAL HYSTERECTOMY     right carpal  tunnel release     TONSILLECTOMY     tonsils removed     UPPER GASTROINTESTINAL ENDOSCOPY     XI ROBOTIC ASSISTED PARAESOPHAGEAL HERNIA REPAIR N/A 12/10/2021   Procedure: XI ROBOTIC ASSISTED PARAESOPHAGEAL HERNIA REPAIR WITH FUNDOPLICATION;  Surgeon: Shyrl Linnie KIDD, MD;  Location: [facility] OR;  Service: Thoracic;  Laterality: N/A;   Patient Active Problem List   Diagnosis Date Noted   S/P repair of paraesophageal hernia 12/10/2021   Acquired hypothyroidism 09/12/2021   Congenital heart disease 09/12/2021   Decreased estrogen level 09/12/2021   Hiatal hernia 09/12/2021   Iron deficiency anemia 09/12/2021   Pure hypercholesterolemia 09/12/2021   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  Did fine with the exercises last time. My back is OK today.     From Eval:Patient presents with left ankle pain that began April 2025, but it is a chronic problem. 40 [place]. Nothing was broken but it bruised really bad. She just has occasional tendonitis flare ups.  Dr. Gershon gave her a boot to wear a week ago but it really flared up her back pain so she went back to the supportive ankle brace. She likes this better. She elevates her ankle when she sits and ices it every two hours for 20 mins. This does help decrease pain. She is still able to keep her steps up (her goal is 10,000 per day), but with increased  activity the pain is worse.  PERTINENT HISTORY: OA; Hypothyroidism; Hx Left knee replacement; years ago left toe fractures when dropped a vacumn cleaner attachment on them  PAIN:  02/15/24: Are you having pain? Yes: NPRS scale: currently 4/10  Pain location: Lt Medial malleoli and feels tight top of the ankle  Pain description: sharp, shooting Aggravating factors: Touching the area; running multiple errands, going up a curb Relieving factors: ice; elevation Advil when the pain is really bad   PRECAUTIONS: None  RED FLAGS: None   WEIGHT BEARING RESTRICTIONS: No  FALLS:  Has patient fallen in last 6 months? No  LIVING ENVIRONMENT: Lives with: lives with their spouse Lives in: House/apartment Stairs: yes stairs but her room is on the main level; stairs to come into the house Has following equipment at home: Grab bars  OCCUPATION: Retired; worked for a church  PLOF: Independent, Independent with basic ADLs, Independent with household mobility without device, Independent with community mobility without device, Independent with gait, Independent with transfers, and Leisure: Travel; hang out with family  PATIENT GOALS: To improve ROM, strength & function   NEXT MD VISIT: 3 weeks  OBJECTIVE:  Note: Objective measures were completed at Evaluation unless otherwise noted.  DIAGNOSTIC FINDINGS:  Ankle MRI 6/19 Impression: 1. Infiltration of the sinus Tarsi and mechanical stress marrow edema around the subtalar joint/sinus Tarsi 2. Proximal plantar aponeurosis degeneration and small plantar calcaneal spur 3. Thickened intermediate signal anterior talofibular and calcaneofibular ligaments 4. Flexor and peroneus longus/brevis tenosynovitis and lesser extensor tenosynovitis 5. Mild edema of the distal calf muscles possibly use or nerve related 6. Marrow edema at the medial malleolar margin and subcutaneous edema at the inner ankle 7. Hindfoot valgus deformity and suggestion of mild  abutment between the lateral malleolus and posterior outer calcaneus  PATIENT SURVEYS:  LEFS: 54/80 67.5%  8/26:  59 % (pt also has knee pain and back pain which affects her function)   COGNITION: Overall cognitive status: Within functional limits for tasks assessed     SENSATION: WFL  EDEMA:  Increased edema Lt medial malleoli    POSTURE: rounded shoulders  PALPATION: Tenderness with palpation around medial malleoli Tenderness with palpation tibialis anterior & calf   LOWER EXTREMITY MNF:EMNF WFL; more discomfort at end range movements    LOWER EXTREMITY MMT: MMT limited due to pain with resistance. Inversion more painful than eversion. Grossly 4-/5   FUNCTIONAL TESTS:  5 times sit to stand: 10.79 sec no UE support (no brace) Timed up and go (TUG): 10.30 sec (no brace; pain)  8/26:  5x STS: 7.20 sec no hands            TUG 6.22   GAIT: Comments: Antalgic gait; decreased  stance on Lt LE                                                                                                                                 TREATMENT DATE: 02/22/2024 NuStep Level 5 10 mins  -PT present to discuss status Left leg on theraband + opposite leg kick (forwards,side,backwards) x 10 Forward T x 10 with 5# KB x 10 unilateral UE support at barre Step ups at stair with Lt x 10 Sit to stand holding 5# DB x 10 Standing heel raise with ball in between  x 10 Manual therapy: soft tissue mobilization in elevation to gastroc, soleus, anterior tibialis and gently to posterior tibialis  02/17/2024 NuStep Level 5 10 mins towel roll for lumbar support -PT present to discuss status Left leg on theraband + oppsite leg kick (forwards,side,backwards) x 10 Forward T x 10 then added 5# KB x 10 unilateral UE support at barre Sit to stand holding 5# DB x 10 Standing heel raise with ball in between 2 x 10 Mini Squats on airex 2 x 10 Single leg stance + cone tap with Rt (two cones in front + patient  having to cross midline) Step ups at stair with Lt x 10    02/15/2024 NuStep Level 5 10 mins towel roll for lumbar support -PT present to discuss status LEFS 5x STS TUG Seated 5# KB resting on knee: toes on small step heel raises (deficit heel lift) 20x Single leg modified RDL with left touch on barre, WB on left 20x Gastroc stretch on wall 30 sec x3 WB on left with circles touches with right toes (circle formation) anticipated and random Standing short foot arch lifts 30 sec; added isometric hold with right toe taps 4 ways 10x  Bil heel raises with ball squeeze b/w heels 20x   02/02/2024 NuStep Level 5 10 mins towel roll for lumbar support -PT present to discuss status Seated 5# KB resting on knee: toes on small step heel raises (deficit heel lift) 20x Staggered stance with right light toe touch with 5# KB hip to hip 2x 10 Single leg modified RDL with left touch on barre, WB on left 10x Gastroc stretch on wall 30 sec x3 WB on left with circles touches with right toes (circle formation) anticipated and random Standing short foot arch lifts 5 sec holds 7 Review of home isometrics Pt given a strip of KT tape for home application since she is unable to come to PT next week   01/31/2024 NuStep Level 5 8 mins towel roll for lumbar support -PT present to discuss status Isometric plantarflexion with green band 3x 45 sec  Isometric inversion with ball 4 x 30 sec Lt  Isometric eversion against her other foot 4x 30 sec Left short foot in standing x 12 Left short foot standing on airex x 12 Standing on airex +Heel raise x 12 Standing: left short foot holding 5 sec  while moving right  right foot forward and back and side to side 2x8 (attempted standing on airex but patient had increased pain. Performed on solid ground) KT tape intact on medial side of ankle/leg, instructed to remove in the next 24-36 hours to assess skin tolerance   PATIENT EDUCATION:  Education details: PT eval findings,  anticipated POC, progress with PT, and initial HEP Person educated: Patient Education method: Explanation, Demonstration, and Handouts Education comprehension: verbalized understanding, returned demonstration, and needs further education  HOME EXERCISE PROGRAM: Access Code: [email or web address] URL: [email or web address] Date: 02/17/2024 Prepared by: Kristeen Sar  Exercises - Supine Ankle Pumps in Elevation on Pillows  - 1-2 x daily - 7 x weekly - 2 sets - 10 reps - Seated Ankle Alphabet  - 1-2 x daily - 7 x weekly - 1 sets - Long Sitting Calf Stretch with Strap  - 1-2 x daily - 7 x weekly - 2 sets - 20-30s hold - Seated Ankle Inversion with Resistance and Legs Crossed  - 1 x daily - 7 x weekly - 2 sets - 10 reps - Long Sitting Ankle Eversion with Resistance  - 1 x daily - 7 x weekly - 2 sets - 10 reps - Ankle Inversion with Resistance  - 1 x daily - 7 x weekly - 2 sets - 10 reps - Gastroc Stretch with Foot at Wall  - 1 x daily - 7 x weekly - 1 sets - 2-3 reps - 30 hold - Seated Arch Lifts  - 1 x daily - 7 x weekly - 1 sets - 10 reps - Standing Calf Raise With Small Ball at Heels  - 1 x daily - 7 x weekly - 3 sets - 10 reps - Forward T with Counter Support  - 1 x daily - 7 x weekly - 3 sets - 10 reps - Sit to Stand  - 1 x daily - 7 x weekly - 1-2 sets - 10 reps - Side Lunge with Counter Support  - 1 x daily - 7 x weekly - 1 sets - 5 reps - Step Up  - 1 x daily - 7 x weekly - 1-2 sets - 10 reps  ASSESSMENT:  CLINICAL IMPRESSION Patient verbalized feeling 80% better since starting therapy. The patient has met the majority of rehab goals, with noted improvements in pain reduction, outcome score, ROM, strength and functional mobility.  A comprehensive HEP has been established and anticipate further improvements over time with regular performance of the program.  Recommend discharge from PT at this time.    OBJECTIVE IMPAIRMENTS: Abnormal gait, decreased activity tolerance, decreased  balance, difficulty walking, decreased ROM, decreased strength, hypomobility, increased edema, impaired perceived functional ability, increased muscle spasms, impaired flexibility, and pain.   ACTIVITY LIMITATIONS: standing, stairs, transfers, and locomotion level  PARTICIPATION LIMITATIONS: meal prep, cleaning, interpersonal relationship, shopping, community activity, and yard work  PERSONAL FACTORS: Age and Time since onset of injury/illness/exacerbation are also affecting patient's functional outcome.   REHAB POTENTIAL: Good  CLINICAL DECISION MAKING: Evolving/moderate complexity  EVALUATION COMPLEXITY: Moderate   GOALS: Goals reviewed with patient? Yes  SHORT TERM GOALS: Target date: 01/25/2024  Patient will be independent with initial HEP. Baseline:  Goal status: met 8/7  2.  Patient will report > or = to 30%  improvement in left ankle pain since starting PT. Baseline:  Goal status: met 8/13   3.  Patient will be able to ascend/ descend a curb with LRAD for improved community mobility.  Baseline:  Goal status: met 8/7   LONG TERM GOALS: Target date: 02/22/2024  Patient will demonstrate independence in advanced HEP. Baseline:  Goal status: met 9/2 2.  Patient will report > or = to 60% improvement in left ankle pain since starting PT. Baseline:  Goal status: met 8/26  3.  Patient will verbalize and demonstrate self-care strategies to manage pain including tissue mobility practices and change of position. Baseline:  Goal status: met 9/2  4.  Patient will be able to walk > or = to 10,000 steps per day with < or = to 2/10 left ankle discomfort. Baseline:  Goal status: met 8/26  5.  Patient will score > or = to 63/80 on LEFS due to improved ability to perform functional activities.  Baseline: 59/80 Goal status: not met possibly related to spine and other LE musculoskeletal issues PLAN:  PT FREQUENCY: 2x/week  PT DURATION: 8 weeks  PLANNED INTERVENTIONS: [id number]- PT  Re-evaluation, 97110-Therapeutic exercises, 97530- Therapeutic activity, 97112- Neuromuscular re-education, 97535- Self Care, [id number]- Manual therapy, [id number]- Gait training, [phone]- Canalith repositioning, [id number]- Aquatic Therapy, [phone]- Electrical stimulation (unattended), [phone]- Electrical stimulation (manual), [id number]- Vasopneumatic device, [id number]- Ultrasound, [id number]- Traction (mechanical), [id number]- Ionotophoresis 4mg /ml Dexamethasone , [id number] (1-2 muscles), [id number] (3+ muscles)- Dry Needling, Patient/Family education, Balance training, Stair training, Taping, Joint mobilization, Joint manipulation, Spinal manipulation, Spinal mobilization, Compression bandaging, Vestibular training, Moist heat, and Biofeedback  PLAN FOR NEXT SESSION: assess updated HEP; discharge patient  PHYSICAL THERAPY DISCHARGE SUMMARY  Visits from Start of Care: 12  Current functional level related to goals / functional outcomes: See clinical impressions above   Remaining deficits: As above   Education / Equipment: HEP   Patient agrees to discharge. Patient goals were met. Patient is being discharged due to meeting the stated rehab goals.  Glade Pesa, PT 02/22/24 5:02 PM Phone: [phone] Fax: [phone]  [facility] [address] [place], [place] [address] Phone # [phone] Fax [phone]

## 2024-03-06 ENCOUNTER — Ambulatory Visit (INDEPENDENT_AMBULATORY_CARE_PROVIDER_SITE_OTHER): Admitting: Podiatry

## 2024-03-06 DIAGNOSIS — M76822 Posterior tibial tendinitis, left leg: Secondary | ICD-10-CM

## 2024-03-06 DIAGNOSIS — R937 Abnormal findings on diagnostic imaging of other parts of musculoskeletal system: Secondary | ICD-10-CM | POA: Diagnosis not present

## 2024-03-07 ENCOUNTER — Other Ambulatory Visit (HOSPITAL_BASED_OUTPATIENT_CLINIC_OR_DEPARTMENT_OTHER): Payer: Self-pay

## 2024-03-07 NOTE — Progress Notes (Signed)
  Subjective:  Patient ID: Carol Lowery, female    DOB: 05-25-1941,  MRN: [id number]  Chief Complaint  Patient presents with   Foot Pain    Pt is here for a follow up on her left foot  She stated that things are going okay     History of Present Illness Carol Lowery is an 83 year old female with posterior tibial tendon issues who presents with severe left ankle pain and swelling.  She states that while she does still have some discomfort she is doing much better.  She did graduate from physical therapy as well.  She is still doing the home stretching, rehab exercises.  She does not report any recent injuries or any changes otherwise.   Objective:    Physical Exam General: AAO x3, NAD  Dermatological: Skin is warm, dry and supple bilateral.  There are no open sores, no preulcerative lesions, no rash or signs of infection present.  Vascular: Dorsalis Pedis artery and Posterior Tibial artery pedal pulses are 2/4 bilateral with immedate capillary fill time.  There is no pain with calf compression, swelling, warmth, erythema.   Neruologic: Grossly intact via light touch bilateral.   Musculoskeletal: Decreased medial arch upon weightbearing.  There is still some mild tenderness palpation of the course of the posterior tibial tendon today mostly proximal to the medial malleolus.  She was having majority tenderness previously inferior and distal to the medial malleolus this is much improved.  Clinically tendon appears to be intact.  She previous is describing more of a pulling sensation that she is no longer feeling as well.  Slight edema still remains.  No erythema or warmth.  No area of pinpoint tenderness.     Assessment:   Posterior tibial tendon dysfunction, marrow edema medial malleolus  Plan:  Patient was evaluated and treated and all questions answered.  Assessment and Plan Assessment & Plan -Just completed physical therapy and overall improving.  We discussed doing shockwave  treatment but overall she is improving so we mutually agreed to hold off on this today.  Encouraged to continue with supportive shoe gear, inserts which she wears and all of her shoes.  Continue with home stretching, rehab exercises.  Return if symptoms worsen or fail to improve.  Donnice JONELLE Fees DPM

## 2024-03-09 ENCOUNTER — Other Ambulatory Visit (HOSPITAL_BASED_OUTPATIENT_CLINIC_OR_DEPARTMENT_OTHER): Payer: Self-pay

## 2024-03-11 ENCOUNTER — Other Ambulatory Visit (HOSPITAL_BASED_OUTPATIENT_CLINIC_OR_DEPARTMENT_OTHER): Payer: Self-pay

## 2024-03-11 MED ORDER — LEVOTHYROXINE SODIUM 75 MCG PO TABS
75.0000 ug | ORAL_TABLET | Freq: Every morning | ORAL | 2 refills | Status: AC
  Filled 2024-03-11: qty 90, 90d supply, fill #0
  Filled 2024-06-06: qty 90, 90d supply, fill #1

## 2024-03-15 ENCOUNTER — Other Ambulatory Visit (HOSPITAL_BASED_OUTPATIENT_CLINIC_OR_DEPARTMENT_OTHER): Payer: Self-pay

## 2024-03-28 ENCOUNTER — Other Ambulatory Visit: Payer: Medicare Other

## 2024-04-03 ENCOUNTER — Ambulatory Visit (HOSPITAL_BASED_OUTPATIENT_CLINIC_OR_DEPARTMENT_OTHER)
Admission: RE | Admit: 2024-04-03 | Discharge: 2024-04-03 | Disposition: A | Discharge status: Home / Self Care | Source: Ambulatory Visit | Attending: Family Medicine | Admitting: Family Medicine

## 2024-04-03 DIAGNOSIS — E2839 Other primary ovarian failure: Secondary | ICD-10-CM | POA: Insufficient documentation

## 2024-06-07 ENCOUNTER — Other Ambulatory Visit (HOSPITAL_BASED_OUTPATIENT_CLINIC_OR_DEPARTMENT_OTHER): Payer: Self-pay

## 2024-06-07 ENCOUNTER — Other Ambulatory Visit: Payer: Self-pay

## 2024-06-09 ENCOUNTER — Other Ambulatory Visit (HOSPITAL_BASED_OUTPATIENT_CLINIC_OR_DEPARTMENT_OTHER): Payer: Self-pay
